# Patient Record
Sex: Male | Born: 1945 | Race: Black or African American | Hispanic: No | Marital: Married | Smoking: Never smoker
Health system: Southern US, Community
[De-identification: ages and names within clinical notes are randomized; demographics above are authoritative.]

## PROBLEM LIST (undated history)

## (undated) DIAGNOSIS — J309 Allergic rhinitis, unspecified: Secondary | ICD-10-CM

## (undated) DIAGNOSIS — I10 Essential (primary) hypertension: Secondary | ICD-10-CM

## (undated) DIAGNOSIS — J189 Pneumonia, unspecified organism: Secondary | ICD-10-CM

## (undated) DIAGNOSIS — N400 Enlarged prostate without lower urinary tract symptoms: Secondary | ICD-10-CM

## (undated) DIAGNOSIS — E785 Hyperlipidemia, unspecified: Secondary | ICD-10-CM

## (undated) HISTORY — PX: PROSTATE SURGERY: SHX751

## (undated) HISTORY — DX: Essential (primary) hypertension: I10

## (undated) HISTORY — DX: Allergic rhinitis, unspecified: J30.9

## (undated) HISTORY — DX: Hyperlipidemia, unspecified: E78.5

## (undated) HISTORY — DX: Pneumonia, unspecified organism: J18.9

---

## 2017-03-08 ENCOUNTER — Emergency Department (HOSPITAL_BASED_OUTPATIENT_CLINIC_OR_DEPARTMENT_OTHER)
Admission: EM | Admit: 2017-03-08 | Discharge: 2017-03-09 | Disposition: A | Discharge status: Home / Self Care | Payer: Medicare Other | Attending: Emergency Medicine | Admitting: Emergency Medicine

## 2017-03-08 ENCOUNTER — Encounter (HOSPITAL_BASED_OUTPATIENT_CLINIC_OR_DEPARTMENT_OTHER): Payer: Self-pay

## 2017-03-08 DIAGNOSIS — R300 Dysuria: Secondary | ICD-10-CM | POA: Diagnosis present

## 2017-03-08 DIAGNOSIS — R339 Retention of urine, unspecified: Secondary | ICD-10-CM

## 2017-03-08 DIAGNOSIS — N3001 Acute cystitis with hematuria: Secondary | ICD-10-CM | POA: Diagnosis not present

## 2017-03-08 HISTORY — DX: Benign prostatic hyperplasia without lower urinary tract symptoms: N40.0

## 2017-03-08 LAB — URINALYSIS, ROUTINE W REFLEX MICROSCOPIC
BILIRUBIN URINE: NEGATIVE
Glucose, UA: NEGATIVE mg/dL
KETONES UR: NEGATIVE mg/dL
NITRITE: POSITIVE — AB
PROTEIN: NEGATIVE mg/dL
Specific Gravity, Urine: 1.025 (ref 1.005–1.030)
pH: 6 (ref 5.0–8.0)

## 2017-03-08 LAB — URINALYSIS, MICROSCOPIC (REFLEX)

## 2017-03-08 MED ORDER — LEVOFLOXACIN 750 MG PO TABS
750.0000 mg | ORAL_TABLET | Freq: Once | ORAL | Status: AC
  Administered 2017-03-08: 750 mg via ORAL
  Filled 2017-03-08: qty 1

## 2017-03-08 MED ORDER — MORPHINE SULFATE (PF) 2 MG/ML IV SOLN
2.0000 mg | Freq: Once | INTRAVENOUS | Status: AC
  Administered 2017-03-08: 2 mg via INTRAVENOUS
  Filled 2017-03-08: qty 1

## 2017-03-08 MED ORDER — LIDOCAINE HCL 2 % EX GEL
CUTANEOUS | Status: AC
Start: 2017-03-08 — End: 2017-03-08
  Administered 2017-03-08: 20
  Filled 2017-03-08: qty 20

## 2017-03-08 NOTE — ED Triage Notes (Signed)
C/o urinary freq and dysuria x 3 weeks

## 2017-03-08 NOTE — ED Notes (Signed)
ED Provider at bedside. 

## 2017-03-08 NOTE — Discharge Instructions (Addendum)
[facility] for urology evaluation and Foley catheter placement. You will need antibiotics at discharge for your urinary tract infection.

## 2017-03-08 NOTE — ED Notes (Signed)
Patient claimed that it hurts and burns when he urinate.  His urine only comes out little and it dribbles per patient and wife.

## 2017-03-08 NOTE — ED Provider Notes (Signed)
[facility]   CSN: [id number] Arrival date & time: 03/08/17  1508     History   Chief Complaint Chief Complaint  Patient presents with  . Urinary Frequency    HPI Steven Jimenez is a 71 y.o. male.  The history is provided by the patient and the spouse. No language interpreter was used.  Dysuria   This is a new problem. The current episode started more than 1 week ago. The problem occurs every urination. The problem has been rapidly worsening. The quality of the pain is described as burning and stabbing. The pain is at a severity of 8/10. The pain is severe. There has been no fever. Associated symptoms include frequency, hesitancy and urgency. Pertinent negatives include no chills, no nausea, no vomiting, no hematuria and no flank pain. He has tried nothing for the symptoms. His past medical history is significant for urological procedure (2 months ago). His past medical history does not include recurrent UTIs.    Past Medical History:  Diagnosis Date  . Enlarged prostate     There are no active problems to display for this patient.   History reviewed. No pertinent surgical history.     Home Medications    Prior to Admission medications   Not on File    Family History No family history on file.  Social History Social History  Substance Use Topics  . Smoking status: Never Smoker  . Smokeless tobacco: Never Used  . Alcohol use Yes     Comment: occ     Allergies   Patient has no known allergies.   Review of Systems Review of Systems  Constitutional: Negative for appetite change, chills, fatigue and fever.  HENT: Negative for congestion.   Respiratory: Negative for chest tightness and shortness of breath.   Cardiovascular: Negative for chest pain, palpitations and leg swelling.  Gastrointestinal: Positive for abdominal pain. Negative for constipation, diarrhea, nausea and vomiting.  Genitourinary: Positive for decreased urine  volume, difficulty urinating, dysuria, frequency, hesitancy, penile pain, penile swelling and urgency. Negative for discharge, flank pain, hematuria and testicular pain.  Musculoskeletal: Negative for back pain and neck pain.  Skin: Negative for rash and wound.  Neurological: Negative for light-headedness and headaches.  Psychiatric/Behavioral: Negative for agitation.  All other systems reviewed and are negative.    Physical Exam Updated Vital Signs BP (!) 161/91 (BP Location: Right Arm)   Pulse 73   Temp 98.3 F (36.8 C) (Oral)   Resp 18   Ht 5\' 5"  (1.651 m)   Wt 79.4 kg (175 lb 0.7 oz)   SpO2 100%   BMI 29.13 kg/m   Physical Exam  Constitutional: He is oriented to person, place, and time. He appears well-developed and well-nourished. No distress.  HENT:  Head: Normocephalic and atraumatic.  Mouth/Throat: Oropharynx is clear and moist. No oropharyngeal exudate.  Eyes: Conjunctivae are normal.  Neck: Neck supple.  Cardiovascular: Normal rate and regular rhythm.   No murmur heard. Pulmonary/Chest: Effort normal and breath sounds normal. No respiratory distress. He has no wheezes. He exhibits no tenderness.  Abdominal: Soft. Normal appearance. There is tenderness in the suprapubic area. There is no rigidity, no rebound, no guarding and no CVA tenderness. Hernia confirmed negative in the right inguinal area and confirmed negative in the left inguinal area.    Genitourinary: Testes normal. Right testis shows no tenderness. Left testis shows no tenderness. Circumcised. No penile erythema or penile tenderness. No discharge found.  Musculoskeletal: He  exhibits no edema or tenderness.  Neurological: He is alert and oriented to person, place, and time. No cranial nerve deficit or sensory deficit. He exhibits normal muscle tone.  Skin: Skin is warm and dry. Capillary refill takes less than 2 seconds. He is not diaphoretic. No erythema. No pallor.  Psychiatric: He has a normal mood and  affect.  Nursing note and vitals reviewed.    ED Treatments / Results  Labs (all labs ordered are listed, but only abnormal results are displayed) Labs Reviewed  URINALYSIS, ROUTINE W REFLEX MICROSCOPIC - Abnormal; Notable for the following:       Result Value   APPearance CLOUDY (*)    Hgb urine dipstick LARGE (*)    Nitrite POSITIVE (*)    Leukocytes, UA LARGE (*)    All other components within normal limits  URINALYSIS, MICROSCOPIC (REFLEX) - Abnormal; Notable for the following:    Bacteria, UA MANY (*)    Squamous Epithelial / LPF 0-5 (*)    All other components within normal limits  URINE CULTURE    EKG  EKG Interpretation None       Radiology No results found.  Procedures Procedures (including critical care time)  Medications Ordered in ED Medications  lidocaine (XYLOCAINE) 2 % jelly (20 application  Given 03/08/17 2026)  levofloxacin (LEVAQUIN) tablet 750 mg (750 mg Oral Given 03/08/17 2045)  morphine 2 MG/ML injection 2 mg (2 mg Intravenous Given 03/08/17 2354)  HYDROcodone-acetaminophen (NORCO/VICODIN) 5-325 MG per tablet 1 tablet (1 tablet Oral Given 03/09/17 0102)     Initial Impression / Assessment and Plan / ED Course  I have reviewed the triage vital signs and the nursing notes.  Pertinent labs & imaging results that were available during my care of the patient were reviewed by me and considered in my medical decision making (see chart for details).     Steven Jimenez is a 71 y.o. male with a past medical history significant for BPH with recent urological procedure 2 months ago who presents with abdominal pain, dysuria, and difficulty with urination. He says that for the last 3 weeks, he has had gradually worsening dysuria. He says that he is having a more difficult time urinating culminating in having only dribbling urination today. He says that he is having lower abdominal cramping and pain that is moderate to severe. He denies recent traumatic injuries. He  denies any significant back pain or back tenderness. He denies fevers, chills, nausea, vomiting, constipation, or diarrhea. He reports that he is from [place] and this is where he had his urology procedure.  On exam, patient has no CVA tenderness. Mild lower abdominal tenderness in the suprapubic area. GU exam revealed uncircumcised penis with no rashes or wounds present. No hernias present on exam. Patient was observed trying to urinate and had distention of the inferior penis as the urethra appeared to have urine flow however approximately 1 cm from the end of the penis at the meatus, patient had no urination. Suspect a very distal urethral problem.  Patient and wife report that patient had a Foley catheter during his procedure that was removed without difficulty.  Patient had urinalysis on small amount of urine he was able to reduce showing evidence of UTI. Patient given antibiotics. Due to concern for urethral abnormality or problem, urology was called. Urology recommended Foley catheter placement attempt. Initially, a 28[address] [place] coud catheter was attempted without success. Subsequently urology was called and they recommended a small normal Foley. This  was also attempted without success.  Due to failure 2 with the smallest Foley catheter present at this facility, decision made to transfer patient to [facility] where he can be evaluated by urology. Patient received a dose of antibiotics prior to transfer.  Patient will be transferred for further management. Anticipate Foley placement by urology and discharged with antibiotics. Patient was transferred in stable condition for further management.   Final Clinical Impressions(s) / ED Diagnoses   Final diagnoses:  Acute cystitis with hematuria  Urinary retention     Clinical Impression: 1. Acute cystitis with hematuria   2. Urinary retention     Disposition: Transfer to [facility] by POV for eval by urology    Tegeler, Canary Brim,  MD 03/09/17 [phone]

## 2017-03-08 NOTE — ED Notes (Signed)
Urologist at bedside.

## 2017-03-08 NOTE — ED Notes (Signed)
Attempted to insert foley catheter using sterile technique per protocol, Windell Mouldinguth RN assisting, unable to insert 116fr coude catheter, obstruction noted at tip of penis. Catheter inserts less than 2 cm, attempted various angles to get catheter to pass unsuccessful. Pt noted with clear urine passing with attempts at inserting catheter. Dr. Julieanne Mansonegler alerted to bedside, multiple attempts are unsuccessful in passing catheter beyond tip of penis. Pt tolerated well, denies any pain, states "I really feel better now, because the urine came out." pt assisted out of bed, full linen change and bed cleaning, gown changed.

## 2017-03-09 DIAGNOSIS — N3001 Acute cystitis with hematuria: Secondary | ICD-10-CM | POA: Diagnosis not present

## 2017-03-09 MED ORDER — HYDROCODONE-ACETAMINOPHEN 5-325 MG PO TABS
1.0000 | ORAL_TABLET | Freq: Once | ORAL | Status: AC
  Administered 2017-03-09: 1 via ORAL
  Filled 2017-03-09: qty 1

## 2017-03-09 MED ORDER — SULFAMETHOXAZOLE-TRIMETHOPRIM 800-160 MG PO TABS: 1.0000 | ORAL_TABLET | Freq: Two times a day (BID) | ORAL | 0 refills | Status: AC

## 2017-03-09 MED ORDER — HYDROCODONE-ACETAMINOPHEN 5-325 MG PO TABS: 1.0000 | ORAL_TABLET | Freq: Four times a day (QID) | ORAL | 0 refills | Status: DC | PRN

## 2017-03-09 NOTE — ED Provider Notes (Signed)
[facility]-EMERGENCY DEPT Provider Note   CSN: [id number] Arrival date & time: 03/08/17  1508     History   Chief Complaint Chief Complaint  Patient presents with  . Urinary Frequency    HPI Candice Lunney is a 71 y.o. male.  HPI  Patient presents for evaluation of worsening urinary retention over past 24 hrs.  No fever/vomiting.  He reports only passing small amt of urine.  He has low abd pain No back pain No other complaints He has h/o "prostate surgery" back in [place]   Past Medical History:  Diagnosis Date  . Enlarged prostate     There are no active problems to display for this patient.   History reviewed. No pertinent surgical history.     Home Medications    Prior to Admission medications   Medication Sig Start Date End Date Taking? Authorizing Provider  phenazopyridine (PYRIDIUM) 97 MG tablet Take 97 mg by mouth daily.   Yes [provider]  HYDROcodone-acetaminophen (NORCO/VICODIN) 5-325 MG tablet Take 1 tablet by mouth every 6 (six) hours as needed for severe pain. 03/09/17   Zadie Rhine, MD  sulfamethoxazole-trimethoprim (BACTRIM DS,SEPTRA DS) 800-160 MG tablet Take 1 tablet by mouth 2 (two) times daily. 03/09/17 03/16/17  Zadie Rhine, MD    Family History No family history on file.  Social History Social History  Substance Use Topics  . Smoking status: Never Smoker  . Smokeless tobacco: Never Used  . Alcohol use Yes     Comment: occ     Allergies   Patient has no known allergies.   Review of Systems Review of Systems  Constitutional: Negative for fever.  Gastrointestinal: Negative for vomiting.  Genitourinary: Positive for difficulty urinating.  All other systems reviewed and are negative.    Physical Exam Updated Vital Signs BP (!) 174/99 (BP Location: Left Arm)   Pulse 76   Temp (!) 97.5 F (36.4 C) (Oral)   Resp 16   Ht 1.651 m (5\' 5" )   Wt 79.4 kg (175 lb 0.7 oz)   SpO2 97%   BMI 29.13 kg/m   Physical  Exam CONSTITUTIONAL: Well developed/well nourished HEAD: Normocephalic/atraumatic EYES: EOMI ENMT: Mucous membranes moist NECK: supple no meningeal signs SPINE/BACK:entire spine nontender CV: S1/S2 noted, no murmurs/rubs/gallops noted LUNGS: Lungs are clear to auscultation bilaterally, no apparent distress ABDOMEN: soft  GU:no cva tenderness, mild suprapubic tenderness. Testicles descended bilaterally, no tenderness, no erythema noted, wife present at patient request NEURO: Pt is awake/alert/appropriate, moves all extremitiesx4.  No facial droop.   EXTREMITIES:   full ROM SKIN: warm, color normal PSYCH: no abnormalities of mood noted, alert and oriented to situation   ED Treatments / Results  Labs (all labs ordered are listed, but only abnormal results are displayed) Labs Reviewed  URINALYSIS, ROUTINE W REFLEX MICROSCOPIC - Abnormal; Notable for the following:       Result Value   APPearance CLOUDY (*)    Hgb urine dipstick LARGE (*)    Nitrite POSITIVE (*)    Leukocytes, UA LARGE (*)    All other components within normal limits  URINALYSIS, MICROSCOPIC (REFLEX) - Abnormal; Notable for the following:    Bacteria, UA MANY (*)    Squamous Epithelial / LPF 0-5 (*)    All other components within normal limits  URINE CULTURE    EKG  EKG Interpretation None       Radiology No results found.  Procedures Procedures (including critical care time)  Medications Ordered in  ED Medications  HYDROcodone-acetaminophen (NORCO/VICODIN) 5-325 MG per tablet 1 tablet (not administered)  lidocaine (XYLOCAINE) 2 % jelly (20 application  Given 03/08/17 2026)  levofloxacin (LEVAQUIN) tablet 750 mg (750 mg Oral Given 03/08/17 2045)  morphine 2 MG/ML injection 2 mg (2 mg Intravenous Given 03/08/17 2354)     Initial Impression / Assessment and Plan / ED Course  I have reviewed the triage vital signs and the nursing notes.  Pertinent labs  results that were available during my care of the  patient were reviewed by me and considered in my medical decision making (see chart for details).     Pt sent for evaluation for foley placement [facility] U[facility] dr winter consulted who placed foley Pt tolerated well Will place leg bag Will start bactrim at urology request Narcotic database reviewed and considered in decision making   Final Clinical Impressions(s) / ED Diagnoses   Final diagnoses:  Acute cystitis with hematuria  Urinary retention    New Prescriptions New Prescriptions   HYDROCODONE-ACETAMINOPHEN (NORCO/VICODIN) 5-325 MG TABLET    Take 1 tablet by mouth every 6 (six) hours as needed for severe pain.   SULFAMETHOXAZOLE-TRIMETHOPRIM (BACTRIM DS,SEPTRA DS) 800-160 MG TABLET    Take 1 tablet by mouth 2 (two) times daily.     Zadie RhineWickline, Oryn Casanova, MD 03/09/17 [phone]

## 2017-03-09 NOTE — Consult Note (Signed)
Urology Preoperative H&P   Chief Complain: Pain with urination  History of Present Illness: Steven Jimenez is a 71 y.o. male who initially presented to the [place] department with difficulty and pain with urination. He underwent a TURP on 12/08/2016 in [place]. Postoperatively, the patient reports a progressively weakening force of stream along with urgency/frequency, hesitancy and dysuria. He denies interval urinary tract infections or gross hematuria. Multiple attempts at the [facility] were made to place a Foley catheter, but were unsuccessful. He was then transferred to [facility] emergency room for urologic evaluation. Past Medical History:  Diagnosis Date  . Enlarged prostate     History reviewed. No pertinent surgical history.  Allergies: No Known Allergies  No family history on file.  Social History:  reports that he has never smoked. He has never used smokeless tobacco. He reports that he drinks alcohol. He reports that he does not use drugs.  ROS: A complete review of systems was performed.  All systems are negative except for pertinent findings as noted.  Physical Exam:  Vital signs in last 24 hours: Temp:  [97.5 F (36.4 C)-98.3 F (36.8 C)] 97.5 F (36.4 C) (09/04 2111) Pulse Rate:  [73-82] 76 (09/04 2258) Resp:  [16-18] 16 (09/04 2258) BP: (151-174)/(86-99) 174/99 (09/04 2258) SpO2:  [96 %-100 %] 97 % (09/04 2258) Weight:  [79.4 kg (175 lb 0.7 oz)] 79.4 kg (175 lb 0.7 oz) (09/04 1527) Constitutional:  Alert and oriented, No acute distress Cardiovascular: Regular rate and rhythm, No JVD Respiratory: Normal respiratory effort, Lungs clear bilaterally GI: Abdomen is soft, nontender, nondistended, no abdominal masses GU: No CVA tenderness. The penis is circumcised and there is no blood at the urethral meatus. Both testicles are descended and demonstrated no masses or nodules, bilaterally. An attempt was made to pass a [address] [place] [employer]  tip catheter, but resistance was met in the fossa navicularis. Lymphatic: No lymphadenopathy Neurologic: Grossly intact, no focal deficits Psychiatric: Normal mood and affect  Laboratory Data:  No results for input(s): WBC, HGB, HCT, PLT in the last 72 hours.  No results for input(s): NA, K, CL, GLUCOSE, BUN, CALCIUM, CREATININE in the last 72 hours.  Invalid input(s): CO3   Results for orders placed or performed during the hospital encounter of 03/08/17 (from the past 24 hour(s))  Urinalysis, Routine w reflex microscopic- may I&O cath if menses     Status: Abnormal   Collection Time: 03/08/17  3:45 PM  Result Value Ref Range   Color, Urine YELLOW YELLOW   APPearance CLOUDY (A) CLEAR   Specific Gravity, Urine 1.025 1.005 - 1.030   pH 6.0 5.0 - 8.0   Glucose, UA NEGATIVE NEGATIVE mg/dL   Hgb urine dipstick LARGE (A) NEGATIVE   Bilirubin Urine NEGATIVE NEGATIVE   Ketones, ur NEGATIVE NEGATIVE mg/dL   Protein, ur NEGATIVE NEGATIVE mg/dL   Nitrite POSITIVE (A) NEGATIVE   Leukocytes, UA LARGE (A) NEGATIVE  Urinalysis, Microscopic (reflex)     Status: Abnormal   Collection Time: 03/08/17  3:45 PM  Result Value Ref Range   RBC / HPF 6-30 0 - 5 RBC/hpf   WBC, UA TOO NUMEROUS TO COUNT 0 - 5 WBC/hpf   Bacteria, UA MANY (A) NONE SEEN   Squamous Epithelial / LPF 0-5 (A) NONE SEEN   No results found for this or any previous visit (from the past 240 hour(s)).  Renal Function: No results for input(s): CREATININE in the last 168 hours. CrCl cannot  be calculated (No order found.).  Radiologic Imaging: No results found.  I independently reviewed the above imaging studies.  Assessment and Plan Becket Wecker is a 71 y.o. 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 A 12 [place] council tip catheter was advanced over the wire and into the patient's bladder, with return of  clear urine. 10 mL of sterile water was then used to inflate the Foley balloon and the catheter was placed to gravity drainage. A urine specimen has already been sent for a urine culture. Will start empiric Bactrim DS for 7 days given his findings on urinalysis. The patient can follow-up with a [facility] as needed in 2-3 weeks. He has scheduled follow-up with his urologist in [place].     Conception Oms [place] 03/09/2017, 12:04 AM

## 2017-03-10 LAB — URINE CULTURE: Culture: NO GROWTH

## 2018-09-25 ENCOUNTER — Inpatient Hospital Stay (HOSPITAL_COMMUNITY)
Admission: EM | Admit: 2018-09-25 | Discharge: 2018-10-03 | DRG: 193 | Disposition: A | Discharge status: Home Health | Payer: Medicare Other | Attending: Family Medicine | Admitting: Family Medicine

## 2018-09-25 ENCOUNTER — Encounter (HOSPITAL_COMMUNITY): Payer: Self-pay

## 2018-09-25 ENCOUNTER — Emergency Department (HOSPITAL_COMMUNITY)
Admission: EM | Admit: 2018-09-25 | Discharge: 2018-09-25 | Discharge status: Against Medical Advice | Payer: Medicare Other

## 2018-09-25 ENCOUNTER — Other Ambulatory Visit: Payer: Self-pay

## 2018-09-25 ENCOUNTER — Emergency Department (HOSPITAL_COMMUNITY): Payer: Medicare Other

## 2018-09-25 DIAGNOSIS — B9729 Other coronavirus as the cause of diseases classified elsewhere: Secondary | ICD-10-CM | POA: Diagnosis present

## 2018-09-25 DIAGNOSIS — E119 Type 2 diabetes mellitus without complications: Secondary | ICD-10-CM | POA: Diagnosis present

## 2018-09-25 DIAGNOSIS — R059 Cough, unspecified: Secondary | ICD-10-CM

## 2018-09-25 DIAGNOSIS — R29703 NIHSS score 3: Secondary | ICD-10-CM | POA: Diagnosis present

## 2018-09-25 DIAGNOSIS — J189 Pneumonia, unspecified organism: Secondary | ICD-10-CM

## 2018-09-25 DIAGNOSIS — R4701 Aphasia: Secondary | ICD-10-CM

## 2018-09-25 DIAGNOSIS — I472 Ventricular tachycardia, unspecified: Secondary | ICD-10-CM

## 2018-09-25 DIAGNOSIS — J1289 Other viral pneumonia: Principal | ICD-10-CM | POA: Diagnosis present

## 2018-09-25 DIAGNOSIS — J9601 Acute respiratory failure with hypoxia: Secondary | ICD-10-CM

## 2018-09-25 DIAGNOSIS — R05 Cough: Secondary | ICD-10-CM | POA: Diagnosis not present

## 2018-09-25 DIAGNOSIS — Z79899 Other long term (current) drug therapy: Secondary | ICD-10-CM | POA: Diagnosis not present

## 2018-09-25 DIAGNOSIS — N400 Enlarged prostate without lower urinary tract symptoms: Secondary | ICD-10-CM | POA: Diagnosis present

## 2018-09-25 DIAGNOSIS — R0902 Hypoxemia: Secondary | ICD-10-CM

## 2018-09-25 DIAGNOSIS — R269 Unspecified abnormalities of gait and mobility: Secondary | ICD-10-CM | POA: Diagnosis present

## 2018-09-25 DIAGNOSIS — Z79891 Long term (current) use of opiate analgesic: Secondary | ICD-10-CM | POA: Diagnosis not present

## 2018-09-25 DIAGNOSIS — R509 Fever, unspecified: Secondary | ICD-10-CM

## 2018-09-25 DIAGNOSIS — E8770 Fluid overload, unspecified: Secondary | ICD-10-CM | POA: Diagnosis not present

## 2018-09-25 DIAGNOSIS — G9341 Metabolic encephalopathy: Secondary | ICD-10-CM | POA: Diagnosis present

## 2018-09-25 DIAGNOSIS — J1281 Pneumonia due to SARS-associated coronavirus: Secondary | ICD-10-CM | POA: Diagnosis present

## 2018-09-25 DIAGNOSIS — F0390 Unspecified dementia without behavioral disturbance: Secondary | ICD-10-CM | POA: Diagnosis present

## 2018-09-25 LAB — DIFFERENTIAL
Abs Immature Granulocytes: 0.03 10*3/uL (ref 0.00–0.07)
Basophils Absolute: 0 10*3/uL (ref 0.0–0.1)
Basophils Relative: 0 %
EOS ABS: 0 10*3/uL (ref 0.0–0.5)
EOS PCT: 0 %
Immature Granulocytes: 0 %
Lymphocytes Relative: 15 %
Lymphs Abs: 1.3 10*3/uL (ref 0.7–4.0)
Monocytes Absolute: 0.5 10*3/uL (ref 0.1–1.0)
Monocytes Relative: 5 %
Neutro Abs: 7.4 10*3/uL (ref 1.7–7.7)
Neutrophils Relative %: 80 %

## 2018-09-25 LAB — COMPREHENSIVE METABOLIC PANEL
ALT: 139 U/L — ABNORMAL HIGH (ref 0–44)
AST: 104 U/L — ABNORMAL HIGH (ref 15–41)
Albumin: 3.3 g/dL — ABNORMAL LOW (ref 3.5–5.0)
Alkaline Phosphatase: 60 U/L (ref 38–126)
Anion gap: 9 (ref 5–15)
BUN: 19 mg/dL (ref 8–23)
CO2: 26 mmol/L (ref 22–32)
Calcium: 8.7 mg/dL — ABNORMAL LOW (ref 8.9–10.3)
Chloride: 105 mmol/L (ref 98–111)
Creatinine, Ser: 1.06 mg/dL (ref 0.61–1.24)
GFR calc Af Amer: >60 mL/min (ref >60)
GFR calc non Af Amer: >60 mL/min (ref >60)
Glucose, Bld: 146 mg/dL — ABNORMAL HIGH (ref 70–99)
POTASSIUM: 4.2 mmol/L (ref 3.5–5.1)
Sodium: 140 mmol/L (ref 135–145)
Total Bilirubin: 0.6 mg/dL (ref 0.3–1.2)
Total Protein: 8.2 g/dL — ABNORMAL HIGH (ref 6.5–8.1)

## 2018-09-25 LAB — CBC
HCT: 47.9 % (ref 39.0–52.0)
Hemoglobin: 15.5 g/dL (ref 13.0–17.0)
MCH: 25.2 pg — ABNORMAL LOW (ref 26.0–34.0)
MCHC: 32.4 g/dL (ref 30.0–36.0)
MCV: 78 fL — AB (ref 80.0–100.0)
PLATELETS: 215 10*3/uL (ref 150–400)
RBC: 6.14 MIL/uL — ABNORMAL HIGH (ref 4.22–5.81)
RDW: 14.1 % (ref 11.5–15.5)
WBC: 9.2 10*3/uL (ref 4.0–10.5)
nRBC: 0 % (ref 0.0–0.2)

## 2018-09-25 LAB — HEMOGLOBIN A1C
Hgb A1c MFr Bld: 7.3 % — ABNORMAL HIGH (ref 4.8–5.6)
Mean Plasma Glucose: 162.81 mg/dL

## 2018-09-25 LAB — PROTIME-INR
INR: 1 (ref 0.8–1.2)
Prothrombin Time: 13.1 seconds (ref 11.4–15.2)

## 2018-09-25 LAB — LACTATE DEHYDROGENASE: LDH: 394 U/L — ABNORMAL HIGH (ref 98–192)

## 2018-09-25 LAB — APTT: APTT: 27 s (ref 24–36)

## 2018-09-25 LAB — SEDIMENTATION RATE: Sed Rate: 26 mm/hr — ABNORMAL HIGH (ref 0–16)

## 2018-09-25 LAB — I-STAT TROPONIN, ED: Troponin i, poc: 0.01 ng/mL (ref 0.00–0.08)

## 2018-09-25 LAB — PROCALCITONIN: Procalcitonin: 0.22 ng/mL

## 2018-09-25 LAB — LACTIC ACID, PLASMA: Lactic Acid, Venous: 1.7 mmol/L (ref 0.5–1.9)

## 2018-09-25 MED ORDER — SODIUM CHLORIDE 0.9 % IV SOLN
500.0000 mg | INTRAVENOUS | Status: DC
  Administered 2018-09-26 – 2018-09-28 (×3): 500 mg via INTRAVENOUS
  Filled 2018-09-25 (×4): qty 500

## 2018-09-25 MED ORDER — ONDANSETRON HCL 4 MG/2ML IJ SOLN: 4.0000 mg | Freq: Four times a day (QID) | INTRAMUSCULAR | Status: DC | PRN

## 2018-09-25 MED ORDER — SODIUM CHLORIDE 0.9 % IV SOLN
1.0000 g | INTRAVENOUS | Status: AC
  Administered 2018-09-26 – 2018-10-01 (×6): 1 g via INTRAVENOUS
  Filled 2018-09-25 (×6): qty 10

## 2018-09-25 MED ORDER — ASPIRIN EC 81 MG PO TBEC
81.0000 mg | DELAYED_RELEASE_TABLET | Freq: Every day | ORAL | Status: DC
  Administered 2018-09-26 – 2018-10-03 (×9): 81 mg via ORAL
  Filled 2018-09-25 (×9): qty 1

## 2018-09-25 MED ORDER — SODIUM CHLORIDE 0.9 % IV SOLN
1.0000 g | Freq: Once | INTRAVENOUS | Status: AC
  Administered 2018-09-25: 1 g via INTRAVENOUS
  Filled 2018-09-25: qty 10

## 2018-09-25 MED ORDER — SODIUM CHLORIDE 0.9% FLUSH
3.0000 mL | Freq: Two times a day (BID) | INTRAVENOUS | Status: DC
  Administered 2018-09-26 – 2018-10-01 (×11): 3 mL via INTRAVENOUS

## 2018-09-25 MED ORDER — SODIUM CHLORIDE 0.9 % IV SOLN
500.0000 mg | Freq: Once | INTRAVENOUS | Status: AC
  Administered 2018-09-25: 500 mg via INTRAVENOUS
  Filled 2018-09-25: qty 500

## 2018-09-25 MED ORDER — HEPARIN SODIUM (PORCINE) 5000 UNIT/ML IJ SOLN
5000.0000 [IU] | Freq: Three times a day (TID) | INTRAMUSCULAR | Status: DC
  Administered 2018-09-26 – 2018-09-27 (×5): 5000 [IU] via SUBCUTANEOUS
  Filled 2018-09-25 (×5): qty 1

## 2018-09-25 MED ORDER — SODIUM CHLORIDE 0.9 % IV SOLN
INTRAVENOUS | Status: AC
  Administered 2018-09-26: 01:00:00 via INTRAVENOUS

## 2018-09-25 MED ORDER — SODIUM CHLORIDE 0.9% FLUSH: 3.0000 mL | Freq: Once | INTRAVENOUS | Status: DC

## 2018-09-25 MED ORDER — ACETAMINOPHEN 325 MG PO TABS: 650.0000 mg | ORAL_TABLET | Freq: Four times a day (QID) | ORAL | Status: DC | PRN

## 2018-09-25 MED ORDER — ONDANSETRON HCL 4 MG PO TABS: 4.0000 mg | ORAL_TABLET | Freq: Four times a day (QID) | ORAL | Status: DC | PRN

## 2018-09-25 MED ORDER — IBUPROFEN 400 MG PO TABS
400.0000 mg | ORAL_TABLET | Freq: Once | ORAL | Status: AC
  Administered 2018-09-25: 400 mg via ORAL
  Filled 2018-09-25: qty 1

## 2018-09-25 NOTE — H&P (Signed)
History and Physical    Steven Jimenez JQZ:[id number] DOB: [birth year] DOA: 09/25/2018  PCP: Patient, No Pcp Per  Patient coming from: PCP office  I have personally briefly reviewed patient's old medical records in [place]  Chief Complaint: Speech difficulty, flulike illness   HPI: Steven Jimenez is a 73 y.o. male with medical history significant for hyperlipidemia and enlarged prostate who presents to ED with fevers, chills, cough productive of white sputum, fatigue, body aches, and shortness of breath.  At the time of my evaluation, patient is able to provide partial history; remainder of history is obtained from EDP and chart review.  In addition to the above symptoms, he reportedly developed aphasia and gait abnormality 2 days ago.  He reports having an episode of clear emesis with occasional difficulty swallowing food.  He denies any diarrhea.  He saw his PCP today at [facility] obtain a chest x-ray which reportedly showed a right-sided pneumonia.  He reportedly had a negative flu test and a coronavirus test which was obtained today and pending.  Patient himself reports a sick contact in his wife who is reportedly on antibiotics for treatment of pneumonia.  He also reports travel back and forth to [place] with last trip about 3 weeks ago.  ED Course:  Initial vitals in the ED showed BP 132/78, pulse 90, RR 18, temp 99.1 Fahrenheit, SPO2 90% on room air.  Repeat vitals in the ED were notable for temp 101.4 Fahrenheit, increased respiratory rate to 28-29, and decreased oxygen saturation to 87% on room air which improved with placement of 3 L supplemental O2 via [place].  Labs are notable for WBC 9.2, hemoglobin 15.5, platelets 215, sodium 140, potassium 4.2, AST 104, ALT 139, alk phos 60, total bilirubin 0.6, BUN 19, creatinine 1.06, lactic acid 1.7, i-STAT troponin 0.01.  Neurology were consulted.  CT head without contrast showed mild age-related volume loss with slight  periventricular small vessel disease, no acute infarct, mass, or hemorrhage.  MRI brain without contrast was ordered and pending.  Portable chest x-ray showed multifocal pneumonia patchy airspace opacities in both lung bases and right midlung suspicious for atypical/viral pneumonia.  Blood cultures were drawn and patient was started on IV ceftriaxone and azithromycin.  He was given oral ibuprofen once.  The hospitalist service was consulted to admit for further evaluation and management.  Review of Systems: As per HPI otherwise 10 point review of systems negative.    Past Medical History:  Diagnosis Date  . Enlarged prostate     History reviewed. No pertinent surgical history.   reports that he has never smoked. He has never used smokeless tobacco. He reports current alcohol use. He reports that he does not use drugs.  No Known Allergies  Family History  Family history unknown: Yes     Prior to Admission medications   Medication Sig Start Date End Date Taking? Authorizing Provider  HYDROcodone-acetaminophen (NORCO/VICODIN) 5-325 MG tablet Take 1 tablet by mouth every 6 (six) hours as needed for severe pain. 03/09/17   Ripley Fraise, MD  phenazopyridine (PYRIDIUM) 97 MG tablet Take 97 mg by mouth daily.    [provider]    Physical Exam: Vitals:   09/25/18 2027 09/25/18 2042 09/25/18 2100 09/25/18 2130  BP:   (!) 143/77 123/80  Pulse: 87 87 87 86  Resp: (!) 24 (!) 25 (!) 28 (!) 29  Temp:      TempSrc:      SpO2: (!) 89%  96% 96% 95%    Constitutional: Elderly man resting supine in bed, NAD, calm, comfortable Eyes: PERRL, EOMI, lids and conjunctivae normal ENMT: Mucous membranes are moist. Posterior pharynx clear of any exudate or lesions. Normal dentition.  Neck: normal, supple, no masses. Respiratory: Distant breath sounds, limited exam using disposable stethoscope.  Normal respiratory effort. No accessory muscle use.  Cardiovascular: Regular rate and rhythm,  no murmurs / rubs / gallops. No extremity edema.  Abdomen: no tenderness, no masses palpated. No hepatosplenomegaly. Bowel sounds positive.  Musculoskeletal: no clubbing / cyanosis. No joint deformity upper and lower extremities. Good ROM, no contractures. Normal muscle tone.  Skin: no rashes, lesions, ulcers. No induration Neurologic: Slow speech, intermittent episodes of questionable aphasia, CN 2-12 otherwise grossly intact. Sensation intact, DTR normal. Strength 5/5 in all 4.  Minimal dysmetria and slow movement with finger-to-nose testing. Psychiatric:  Alert and oriented to person, place, and year. Normal mood.     Labs on Admission: I have personally reviewed following labs and imaging studies  CBC: Recent Labs  Lab 09/25/18 1804  WBC 9.2  NEUTROABS 7.4  HGB 15.5  HCT 47.9  MCV 78.0*  PLT 488   Basic Metabolic Panel: Recent Labs  Lab 09/25/18 1804  NA 140  K 4.2  CL 105  CO2 26  GLUCOSE 146*  BUN 19  CREATININE 1.06  CALCIUM 8.7*   GFR: CrCl cannot be calculated (Unknown ideal weight.). Liver Function Tests: Recent Labs  Lab 09/25/18 1804  AST 104*  ALT 139*  ALKPHOS 60  BILITOT 0.6  PROT 8.2*  ALBUMIN 3.3*   No results for input(s): LIPASE, AMYLASE in the last 168 hours. No results for input(s): AMMONIA in the last 168 hours. Coagulation Profile: Recent Labs  Lab 09/25/18 1804  INR 1.0   Cardiac Enzymes: No results for input(s): CKTOTAL, CKMB, CKMBINDEX, TROPONINI in the last 168 hours. BNP (last 3 results) No results for input(s): PROBNP in the last 8760 hours. HbA1C: Recent Labs    09/25/18 2136  HGBA1C 7.3*   CBG: No results for input(s): GLUCAP in the last 168 hours. Lipid Profile: No results for input(s): CHOL, HDL, LDLCALC, TRIG, CHOLHDL, LDLDIRECT in the last 72 hours. Thyroid Function Tests: No results for input(s): TSH, T4TOTAL, FREET4, T3FREE, THYROIDAB in the last 72 hours. Anemia Panel: No results for input(s): VITAMINB12,  FOLATE, FERRITIN, TIBC, IRON, RETICCTPCT in the last 72 hours. Urine analysis:    Component Value Date/Time   COLORURINE YELLOW 03/08/2017 1545   APPEARANCEUR CLOUDY (A) 03/08/2017 1545   LABSPEC 1.025 03/08/2017 1545   PHURINE 6.0 03/08/2017 1545   GLUCOSEU NEGATIVE 03/08/2017 1545   HGBUR LARGE (A) 03/08/2017 1545   BILIRUBINUR NEGATIVE 03/08/2017 1545   KETONESUR NEGATIVE 03/08/2017 1545   PROTEINUR NEGATIVE 03/08/2017 1545   NITRITE POSITIVE (A) 03/08/2017 1545   LEUKOCYTESUR LARGE (A) 03/08/2017 1545    Radiological Exams on Admission: Ct Head Wo Contrast  Result Date: 09/25/2018 CLINICAL DATA:  Slurred speech and altered mental status EXAM: CT HEAD WITHOUT CONTRAST TECHNIQUE: Contiguous axial images were obtained from the base of the skull through the vertex without intravenous contrast. COMPARISON:  None. FINDINGS: Brain: There is mild age related volume loss. There is no intracranial mass, hemorrhage, extra-axial fluid collection, or midline shift. There is slight small vessel disease in the centra semiovale bilaterally. Brain parenchyma otherwise appears unremarkable. No acute infarct evident. Vascular: There is no hyperdense vessel. There is calcification in each carotid siphon region. Skull: The  bony calvarium appears intact. Sinuses/Orbits: Paranasal sinuses are clear. Orbits appear symmetric bilaterally. Other: Mastoid air cells are clear. IMPRESSION: Mild age related volume loss with slight periventricular small vessel disease. No acute infarct. No mass or hemorrhage. There are foci of arterial vascular calcification. Electronically Signed   By: Lowella Grip III M.D.   On: 09/25/2018 18:53   Dg Chest Portable 1 View  Result Date: 09/25/2018 CLINICAL DATA:  Shortness of breath with cough and fever EXAM: PORTABLE CHEST 1 VIEW COMPARISON:  None. FINDINGS: There is patchy airspace opacity in the right mid lung and both base regions. Heart is upper normal in size with pulmonary  vascularity normal. No adenopathy. No bone lesions. IMPRESSION: Multifocal pneumonia with patchy airspace opacity in both lung bases and right mid lung regions. Atypical pneumonia including viral pneumonia can present in this manner. No adenopathy evident. Electronically Signed   By: Lowella Grip III M.D.   On: 09/25/2018 20:01    EKG: Independently reviewed. Normal sinus rhythm, RBBB, LAFB, no prior for comparison.  Assessment/Plan Principal Problem:   Acute respiratory failure with hypoxia (HCC) Active Problems:   Multifocal pneumonia   Aphasia  Steven Jimenez is a 73 y.o. male with medical history significant for hyperlipidemia and enlarged prostate who is admitted with acute respiratory failure with hypoxia and multifocal pneumonia seen on chest x-ray as well as 2 days of aphasia and gait abnormality concerning for stroke.  Acute respiratory failure with hypoxia Multifocal pneumonia: Patient with fever, cough, shortness of breath and multifocal pneumonia seen on chest x-ray concerning for atypical/viral pneumonia including Covid-19 infection.  Other possibility would be significant aspiration pneumonia given report of occasional dysphagia and presenting aphasia. -Admit to 2W for Covid-19 rule out -Continue contact and droplet precautions with hypertension -Check respiratory viral panel and novel coronavirus -Monitor CBC with differential and CMP -Check ESR, CRP, DH, procalcitonin -Continue IV ceftriaxone and azithromycin for now -Continue IV fluids overnight -Supplemental oxygen as needed, wean off as able  Aphasia/gait abnormality: Initially completely aphasic on arrival in the ED, now improving and answering questions with slurred speech and intermittent aphasia.  Neurology were consulted and recommended MRI to evaluate for stroke. -Discussed with neurology, MRI brain and MRA Head w/o contrast ordered; further recommendations pending imaging results -Continue aspirin 81 mg  daily -Hemoglobin A1c 7.3%, lipid panel ordered and pending -Allow for permissive hypertension -PT/OT/SLP eval -Holding off on echocardiogram for now   DVT prophylaxis: subq heparin Code Status: Full code, confirmed with patient Family Communication: None present at bedside on admission, attempted to call daughter without answer Disposition Plan: Pending further management and improvement in acute respiratory failure with hypoxia, stroke work-up Consults called: Neurology Admission status: Inpatient, patient likely requires greater than 2 midnights inpatient stay for management of acute respiratory failure with hypoxia and stroke work-up as he is high risk for decompensation.   Zada Finders MD [facility] Pager [phone]  If 7PM-7AM, please contact night-coverage [email or web address]  09/25/2018, 10:25 PM

## 2018-09-25 NOTE — ED Notes (Signed)
ED Provider at bedside. 

## 2018-09-25 NOTE — ED Notes (Addendum)
Pts daughter upset that she was restricted from going back with the patient. She said " he is going in and out of consciousness and cant speak for himself"  Pt was interactive with staff , smiling, waving.  Director Misty Stanley Toben tried numerous times to speak to daughter, daughter did not allow her to talk. Daughter had told Nurse 1st pt had a corona test today, and that she also was tested and should be in quarantine.   Director tried to ask for a phone number and explain that once the pt was evaluated a provider would determine if a family member would be needed at the bedside.  Daughter replied " I see what is happening here, I am being discriminated against because he had a corona test, you will hear from my attorney" Daughter pushed pts wheelchair out of the ED still arguing that she was calling her attorney.  Daughter refused to allow pt. To be evaluated or determine if he needed a family member at bedside. Daughter was asked to leave her phone number and sit in the car while he was evaluated and staff would call her as soon as he was evaluated by a provider.

## 2018-09-25 NOTE — ED Notes (Signed)
This Clinical research associate spoke with family member PTA. Family member requesting pt have a "CT scan for a stroke." Per family member pt leaving primary care at present en route to the emergency department after a workup for "pnuemonia and stroke and had a coronavirus test;" per family member pt has a CT scan scheduled for tomorrow but is requesting a CT scan now because the pt can barely stand and has been weak for two days now. This Clinical research associate informed family member that pt would need to ask for a face mask and check in with registration on arrival.   When the pt arrived to the emergency department, family member upset related to being told could not come back with the pt to a room due to current visitation restrictions. RN 1st notified this Clinical research associate. This Clinical research associate discussed with the provider and director about family member being upset not coming back with the pt. It was discussed that the family member could leave a phone number so the provider and care staff could call her if after evaluation the provider had questions or felt that a family member would be needed a the bedside. Director, Chiropodist, and myself went to speak with upset family member. Family member informed of no visitor due to current visitor restrictions as a result of COVID. Family member verbalizes "he cannot communicate for himself." Pt alert and waved to Catering manager. Director attempted to communicate with the family member by stating that we will take the pt back to evaluate him, and if he is not able to communicate for himself then we can call you. The family member does not allow the director to finish communication and walks out with pt verbalizing we will hear from her attorney because she feels like he is being discriminated against for having a COVID test.

## 2018-09-25 NOTE — ED Notes (Signed)
ED TO INPATIENT HANDOFF REPORT  ED Nurse Name and Phone #: Ladona Ridgelaylor [phone]  S Name/Age/Gender Steven Jimenez 73 y.o. male Room/Bed: 043C/043C  Code Status   Code Status: Not on file  Home/SNF/Other Home Patient oriented to: self, place and time Is this baseline? No   Triage Complete: Triage complete  Chief Complaint Slurred Speech; Chest Pain; Loss of Balance  Triage Note Pt here for slurred speech and generalized weakness for >24 hrs.  Pt reports yesterday afternoon before dinner was the last he felt normal. Weakness is generalized no specific to a limb or a side.  A&Ox4. VAN neg   Allergies No Known Allergies  Level of Care/Admitting Diagnosis ED Disposition    ED Disposition Condition Comment   Admit  [facility] [[id number]]  Level of Care: Telemetry Medical [104]  Diagnosis: Acute respiratory failure with hypoxia [facility](HCC) [[id number]]  Admitting Physician: Charlsie QuestPATEL, VISHAL R [[id number]]  Attending Physician: Charlsie QuestPATEL, VISHAL R [[id number]]  Estimated length of stay: past midnight tomorrow  Certification:: I certify this patient will need inpatient services for at least 2 midnights  Bed request comments: covid-19 rule out, low risk  PT Class (Do Not Modify): Inpatient [101]  PT Acc Code (Do Not Modify): Private [1]       B Medical/Surgery History Past Medical History:  Diagnosis Date  . Enlarged prostate    History reviewed. No pertinent surgical history.   A IV Location/Drains/Wounds Patient Lines/Drains/Airways Status   Active Line/Drains/Airways    Name:   Placement date:   Placement time:   Site:   Days:   Peripheral IV 03/08/17 Right Antecubital   03/08/17    1646    Antecubital   566   Peripheral IV 09/25/18 Right Antecubital   09/25/18    2020    Antecubital   less than 1          Intake/Output Last 24 hours No intake or output data in the 24 hours ending 09/25/18 2116  Labs/Imaging Results for orders placed or performed during  the hospital encounter of 09/25/18 (from the past 48 hour(s))  Protime-INR     Status: None   Collection Time: 09/25/18  6:04 PM  Result Value Ref Range   Prothrombin Time 13.1 11.4 - 15.2 seconds   INR 1.0 0.8 - 1.2    Comment: (NOTE) INR goal varies based on device and disease states. Performed at [facility], [address] [place], [place] [address]   APTT     Status: None   Collection Time: 09/25/18  6:04 PM  Result Value Ref Range   aPTT 27 24 - 36 seconds    Comment: Performed at [facility], [address] [place], [place] [address]  CBC     Status: Abnormal   Collection Time: 09/25/18  6:04 PM  Result Value Ref Range   WBC 9.2 4.0 - 10.5 K/uL   RBC 6.14 (H) 4.22 - 5.81 MIL/uL   Hemoglobin 15.5 13.0 - 17.0 g/dL   HCT 29.547.9 28.439.0 - 13.252.0 %   MCV 78.0 (L) 80.0 - 100.0 fL   MCH 25.2 (L) 26.0 - 34.0 pg   MCHC 32.4 30.0 - 36.0 g/dL   RDW 44.014.1 10.211.5 - 72.515.5 %   Platelets 215 150 - 400 K/uL   nRBC 0.0 0.0 - 0.2 %    Comment: Performed at [facility], [address] [place], [place] [address]  Differential  Status: None   Collection Time: 09/25/18  6:04 PM  Result Value Ref Range   Neutrophils Relative % 80 %   Neutro Abs 7.4 1.7 - 7.7 K/uL   Lymphocytes Relative 15 %   Lymphs Abs 1.3 0.7 - 4.0 K/uL   Monocytes Relative 5 %   Monocytes Absolute 0.5 0.1 - 1.0 K/uL   Eosinophils Relative 0 %   Eosinophils Absolute 0.0 0.0 - 0.5 K/uL   Basophils Relative 0 %   Basophils Absolute 0.0 0.0 - 0.1 K/uL   Immature Granulocytes 0 %   Abs Immature Granulocytes 0.03 0.00 - 0.07 K/uL    Comment: Performed at [facility], [address] [place], [place] [address]  Comprehensive metabolic panel     Status: Abnormal   Collection Time: 09/25/18  6:04 PM  Result Value Ref Range   Sodium 140 135 - 145 mmol/L   Potassium 4.2 3.5 - 5.1 mmol/L   Chloride 105 98 - 111 mmol/L   CO2 26 22 - 32 mmol/L   Glucose, Bld 146 (H) 70 - 99 mg/dL   BUN 19 8 - 23 mg/dL    Creatinine, Ser 6.04 0.61 - 1.24 mg/dL   Calcium 8.7 (L) 8.9 - 10.3 mg/dL   Total Protein 8.2 (H) 6.5 - 8.1 g/dL   Albumin 3.3 (L) 3.5 - 5.0 g/dL   AST 540 (H) 15 - 41 U/L   ALT 139 (H) 0 - 44 U/L   Alkaline Phosphatase 60 38 - 126 U/L   Total Bilirubin 0.6 0.3 - 1.2 mg/dL   GFR calc non Af Amer >60 >60 mL/min   GFR calc Af Amer >60 >60 mL/min   Anion gap 9 5 - 15    Comment: Performed at [facility], [address], [place], [place] [address]  Lactic acid, plasma     Status: None   Collection Time: 09/25/18  8:22 PM  Result Value Ref Range   Lactic Acid, Venous 1.7 0.5 - 1.9 mmol/L    Comment: Performed at [facility], [address], [place], [place] [address]  I-Stat Troponin, ED (not at [facility])     Status: None   Collection Time: 09/25/18  8:42 PM  Result Value Ref Range   Troponin i, poc 0.01 0.00 - 0.08 ng/mL   Comment 3            Comment: Due to the release kinetics of cTnI, a negative result within the first hours of the onset of symptoms does not rule out myocardial infarction with certainty. If myocardial infarction is still suspected, repeat the test at appropriate intervals.    Ct Head Wo Contrast  Result Date: 09/25/2018 CLINICAL DATA:  Slurred speech and altered mental status EXAM: CT HEAD WITHOUT CONTRAST TECHNIQUE: Contiguous axial images were obtained from the base of the skull through the vertex without intravenous contrast. COMPARISON:  None. FINDINGS: Brain: There is mild age related volume loss. There is no intracranial mass, hemorrhage, extra-axial fluid collection, or midline shift. There is slight small vessel disease in the centra semiovale bilaterally. Brain parenchyma otherwise appears unremarkable. No acute infarct evident. Vascular: There is no hyperdense vessel. There is calcification in each carotid siphon region. Skull: The bony calvarium appears intact. Sinuses/Orbits: Paranasal sinuses are clear. Orbits appear symmetric bilaterally.  Other: Mastoid air cells are clear. IMPRESSION: Mild age related volume loss with slight periventricular small vessel disease. No acute infarct. No mass or hemorrhage. There are  foci of arterial vascular calcification. Electronically Signed   By: Bretta Bang III M.D.   On: 09/25/2018 18:53   Dg Chest Portable 1 View  Result Date: 09/25/2018 CLINICAL DATA:  Shortness of breath with cough and fever EXAM: PORTABLE CHEST 1 VIEW COMPARISON:  None. FINDINGS: There is patchy airspace opacity in the right mid lung and both base regions. Heart is upper normal in size with pulmonary vascularity normal. No adenopathy. No bone lesions. IMPRESSION: Multifocal pneumonia with patchy airspace opacity in both lung bases and right mid lung regions. Atypical pneumonia including viral pneumonia can present in this manner. No adenopathy evident. Electronically Signed   By: Bretta Bang III M.D.   On: 09/25/2018 20:01    Pending Labs Unresulted Labs (From admission, onward)    Start     Ordered   09/25/18 1939  Lactic acid, plasma  Now then every 2 hours,   STAT     09/25/18 1940   09/25/18 1939  Blood culture (routine x 2)  BLOOD CULTURE X 2,   STAT     09/25/18 1940          Vitals/Pain Today's Vitals   09/25/18 1930 09/25/18 2019 09/25/18 2027 09/25/18 2042  BP: 135/69 (!) 144/82    Pulse: 88 91 87 87  Resp: (!) 28 (!) 35 (!) 24 (!) 25  Temp:      TempSrc:      SpO2: 94% 91% (!) 89% 96%  PainSc:        Isolation Precautions No active isolations  Medications Medications  sodium chloride flush (NS) 0.9 % injection 3 mL (3 mLs Intravenous Not Given 09/25/18 2029)  azithromycin (ZITHROMAX) 500 mg in sodium chloride 0.9 % 250 mL IVPB (500 mg Intravenous New Bag/Given 09/25/18 2101)  cefTRIAXone (ROCEPHIN) 1 g in sodium chloride 0.9 % 100 mL IVPB (0 g Intravenous Stopped 09/25/18 2100)  ibuprofen (ADVIL,MOTRIN) tablet 400 mg (400 mg Oral Given 09/25/18 2044)    Mobility walks Low fall risk    Focused Assessments Neuro Assessment Handoff:  Swallow screen pass? Yes    NIH Stroke Scale ( + Modified Stroke Scale Criteria)  Interval: Initial Level of Consciousness (1a.)   : Alert, keenly responsive LOC Questions (1b. )   +: Answers both questions correctly LOC Commands (1c. )   + : Performs both tasks correctly Best Gaze (2. )  +: Normal Visual (3. )  +: No visual loss Facial Palsy (4. )    : Normal symmetrical movements Motor Arm, Left (5a. )   +: No drift Motor Arm, Right (5b. )   +: No drift Motor Leg, Left (6a. )   +: No drift Motor Leg, Right (6b. )   +: No drift Limb Ataxia (7. ): Absent Sensory (8. )   +: Normal, no sensory loss Best Language (9. )   +: Severe aphasia Dysarthria (10. ): Mild-to-moderate dysarthria, patient slurs at least some words and, at worst, can be understood with some difficulty Extinction/Inattention (11.)   +: No Abnormality Modified SS Total  +: 2 Complete NIHSS TOTAL: 3 Last date known well: 09/24/18 Last time known well: 1700 Neuro Assessment:   Neuro Checks:   Initial (09/25/18 2003)  Last Documented NIHSS Modified Score: 2 (09/25/18 2003) Has TPA been given? No If patient is a Neuro Trauma and patient is going to OR before floor call report to 4N Charge nurse: [phone] or [phone]     R Recommendations: See Admitting  Provider Note  Report given to:   Additional Notes: Pts wife is being treated for pneumonia at home, pt saw PCP yesterday and was given oral abx to take, has not been able to get them filled yet. Pt was tested for COVID-19 at PCP office, mild non productive cough. Rectal temp was 101.4, ibuprofen given in ED. Lung sounds course crackles bilaterally. Pt alert, oriented x3. Pts aphasia has improved since arriving to ED.

## 2018-09-25 NOTE — ED Notes (Signed)
Pt placed on 3L [place] due to sats dropping to 88% while on room air

## 2018-09-25 NOTE — ED Notes (Signed)
Delay in hanging zithromax due to the medication is not compatible with rocephin and this RN attempted to get a 2nd iv access twice without success

## 2018-09-25 NOTE — ED Notes (Signed)
Pt's dtr Selena at [phone] (Primary). Pt's Dtr Luna Glasgow Ph# [phone]. Per dtr Pt is HOH.

## 2018-09-25 NOTE — ED Notes (Signed)
Per Pt's Dtr. Salena pt was seen at PCP Armando Gang at [facility] today. 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

## 2018-09-25 NOTE — ED Provider Notes (Signed)
[facility] EMERGENCY DEPARTMENT Provider Note   CSN: [id number] Arrival date & time: 09/25/18  1751    History   Chief Complaint Chief Complaint  Patient presents with   Weakness   Aphasia    HPI Steven Jimenez is a 73 y.o. male.     The history is provided by a relative, the patient and medical records (daughter). No language interpreter was used.  Neurologic Problem  This is a new problem. The current episode started 2 days ago. The problem occurs constantly. The problem has been gradually worsening. Associated symptoms include shortness of breath. Pertinent negatives include no chest pain, no abdominal pain and no headaches. Nothing aggravates the symptoms. Nothing relieves the symptoms. He has tried nothing for the symptoms. The treatment provided no relief.  Cough  Cough characteristics:  Productive Sputum characteristics:  White Severity:  Moderate Onset quality:  Gradual Duration:  3 days Timing:  Intermittent Progression:  Waxing and waning Chronicity:  New Context: sick contacts (wife with PNA)   Relieved by:  Nothing Worsened by:  Nothing Associated symptoms: chills, fever and shortness of breath   Associated symptoms: no chest pain, no diaphoresis and no headaches     Past Medical History:  Diagnosis Date   Enlarged prostate     There are no active problems to display for this patient.   History reviewed. No pertinent surgical history.      Home Medications    Prior to Admission medications   Medication Sig Start Date End Date Taking? Authorizing Provider  HYDROcodone-acetaminophen (NORCO/VICODIN) 5-325 MG tablet Take 1 tablet by mouth every 6 (six) hours as needed for severe pain. 03/09/17   Zadie Rhine, MD  phenazopyridine (PYRIDIUM) 97 MG tablet Take 97 mg by mouth daily.    [provider]    Family History History reviewed. No pertinent family history.  Social History Social History   Tobacco Use    Smoking status: Never Smoker   Smokeless tobacco: Never Used  Substance Use Topics   Alcohol use: Yes    Comment: occ   Drug use: No     Allergies   Patient has no known allergies.   Review of Systems Review of Systems  Unable to perform ROS: Patient nonverbal  Constitutional: Positive for chills, fatigue and fever. Negative for diaphoresis.  HENT: Negative for congestion.   Respiratory: Positive for cough and shortness of breath. Negative for choking and chest tightness.   Cardiovascular: Negative for chest pain.  Gastrointestinal: Negative for abdominal pain, constipation, diarrhea, nausea and vomiting.  Musculoskeletal: Negative for back pain, neck pain and neck stiffness.  Neurological: Positive for speech difficulty. Negative for weakness, light-headedness and headaches.  Psychiatric/Behavioral: Negative for agitation.     Physical Exam Updated Vital Signs BP 132/78 (BP Location: Left Arm)    Pulse 90    Temp 99.1 F (37.3 C) (Oral)    Resp 18    SpO2 90%   Physical Exam Vitals signs and nursing note reviewed.  Constitutional:      General: He is not in acute distress.    Appearance: He is well-developed. He is not ill-appearing, toxic-appearing or diaphoretic.  HENT:     Head: Normocephalic and atraumatic.     Nose: No congestion or rhinorrhea.     Mouth/Throat:     Pharynx: No oropharyngeal exudate or posterior oropharyngeal erythema.  Eyes:     Conjunctiva/sclera: Conjunctivae normal.     Pupils: Pupils are equal, round, and reactive  to light.  Neck:     Musculoskeletal: Neck supple. No muscular tenderness.  Cardiovascular:     Rate and Rhythm: Normal rate and regular rhythm.     Heart sounds: No murmur.  Pulmonary:     Effort: Tachypnea present. No respiratory distress.     Breath sounds: Rhonchi present. No wheezing or rales.  Chest:     Chest wall: No tenderness.  Abdominal:     Palpations: Abdomen is soft.     Tenderness: There is no abdominal  tenderness.  Musculoskeletal:        General: No tenderness.     Right lower leg: No edema.     Left lower leg: No edema.  Skin:    General: Skin is warm and dry.     Capillary Refill: Capillary refill takes less than 2 seconds.     Findings: No erythema.  Neurological:     Mental Status: He is alert.     Sensory: No sensory deficit.     Motor: No weakness.     Coordination: Coordination normal. Finger-Nose-Finger Test normal.     Gait: Gait normal.     Comments: Patient had expressive aphasia and could not answer any questions aside from occasional one-word answers.  Per daughter this is new over the last 2 days.  Otherwise normal sensation and strength in extremities.  No facial droop.  Normal extraocular movements.  Normal finger-nose-finger testing.  Slow but steady gait.  Psychiatric:        Mood and Affect: Mood normal.      ED Treatments / Results  Labs (all labs ordered are listed, but only abnormal results are displayed) Labs Reviewed  CBC - Abnormal; Notable for the following components:      Result Value   RBC 6.14 (*)    MCV 78.0 (*)    MCH 25.2 (*)    All other components within normal limits  COMPREHENSIVE METABOLIC PANEL - Abnormal; Notable for the following components:   Glucose, Bld 146 (*)    Calcium 8.7 (*)    Total Protein 8.2 (*)    Albumin 3.3 (*)    AST 104 (*)    ALT 139 (*)    All other components within normal limits  SEDIMENTATION RATE - Abnormal; Notable for the following components:   Sed Rate 26 (*)    All other components within normal limits  LACTATE DEHYDROGENASE - Abnormal; Notable for the following components:   LDH 394 (*)    All other components within normal limits  HEMOGLOBIN A1C - Abnormal; Notable for the following components:   Hgb A1c MFr Bld 7.3 (*)    All other components within normal limits  CULTURE, BLOOD (ROUTINE X 2)  CULTURE, BLOOD (ROUTINE X 2)  NOVEL CORONAVIRUS, NAA (HOSPITAL ORDER, SEND-OUT TO REF LAB)   RESPIRATORY PANEL BY PCR  PROTIME-INR  APTT  DIFFERENTIAL  LACTIC ACID, PLASMA  PROCALCITONIN  INFLUENZA PANEL BY PCR (TYPE A & B)  CBC WITH DIFFERENTIAL/PLATELET  COMPREHENSIVE METABOLIC PANEL  LEGIONELLA PNEUMOPHILA SEROGP 1 UR AG  STREP PNEUMONIAE URINARY ANTIGEN  LIPID PANEL  C-REACTIVE PROTEIN  I-STAT TROPONIN, ED    EKG EKG Interpretation  Date/Time:  Monday September 25 2018 16:56:52 EDT Ventricular Rate:  95 PR Interval:  156 QRS Duration: 132 QT Interval:  382 QTC Calculation: 480 R Axis:   -63 Text Interpretation:  Normal sinus rhythm 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  No STEMI Confirmed by Theda Belfast ([id number]) on 09/25/2018 9:05:30 PM   Radiology Ct Head Wo Contrast  Result Date: 09/25/2018 CLINICAL DATA:  Slurred speech and altered mental status EXAM: CT HEAD WITHOUT CONTRAST TECHNIQUE: Contiguous axial images were obtained from the base of the skull through the vertex without intravenous contrast. COMPARISON:  None. FINDINGS: Brain: There is mild age related volume loss. There is no intracranial mass, hemorrhage, extra-axial fluid collection, or midline shift. There is slight small vessel disease in the centra semiovale bilaterally. Brain parenchyma otherwise appears unremarkable. No acute infarct evident. Vascular: There is no hyperdense vessel. There is calcification in each carotid siphon region. Skull: The bony calvarium appears intact. Sinuses/Orbits: Paranasal sinuses are clear. Orbits appear symmetric bilaterally. Other: Mastoid air cells are clear. IMPRESSION: Mild age related volume loss with slight periventricular small vessel disease. No acute infarct. No mass or hemorrhage. There are foci of arterial vascular calcification. Electronically Signed   By: Bretta Bang III M.D.    On: 09/25/2018 18:53   Dg Chest Portable 1 View  Result Date: 09/25/2018 CLINICAL DATA:  Shortness of breath with cough and fever EXAM: PORTABLE CHEST 1 VIEW COMPARISON:  None. FINDINGS: There is patchy airspace opacity in the right mid lung and both base regions. Heart is upper normal in size with pulmonary vascularity normal. No adenopathy. No bone lesions. IMPRESSION: Multifocal pneumonia with patchy airspace opacity in both lung bases and right mid lung regions. Atypical pneumonia including viral pneumonia can present in this manner. No adenopathy evident. Electronically Signed   By: Bretta Bang III M.D.   On: 09/25/2018 20:01    Procedures Procedures (including critical care time)  CRITICAL CARE Performed by: Canary Brim Dajana Gehrig Total critical care time: 45 minutes Critical care time was exclusive of separately billable procedures and treating other patients. Critical care was necessary to treat or prevent imminent or life-threatening deterioration. Critical care was time spent personally by me on the following activities: development of treatment plan with patient and/or surrogate as well as nursing, discussions with consultants, evaluation of patient's response to treatment, examination of patient, obtaining history from patient or surrogate, ordering and performing treatments and interventions, ordering and review of laboratory studies, ordering and review of radiographic studies, pulse oximetry and re-evaluation of patient's condition.   Medications Ordered in ED Medications  sodium chloride flush (NS) 0.9 % injection 3 mL (3 mLs Intravenous Not Given 09/25/18 2029)  cefTRIAXone (ROCEPHIN) 1 g in sodium chloride 0.9 % 100 mL IVPB (has no administration in time range)  azithromycin (ZITHROMAX) 500 mg in sodium chloride 0.9 % 250 mL IVPB (has no administration in time range)  heparin injection 5,000 Units (has no administration in time range)  sodium chloride flush (NS) 0.9 %  injection 3 mL (has no administration in time range)  0.9 %  sodium chloride infusion (has no administration in time range)  acetaminophen (TYLENOL) tablet 650 mg (has no administration in time range)  ondansetron (ZOFRAN) tablet 4 mg (has no administration in time range)    Or  ondansetron (ZOFRAN) injection 4 mg (has no administration in time range)  aspirin EC tablet 81 mg (has no administration in time range)  cefTRIAXone (ROCEPHIN) 1 g in sodium chloride 0.9 % 100 mL IVPB (0 g Intravenous Stopped 09/25/18 2100)  azithromycin (ZITHROMAX) 500 mg in sodium chloride 0.9 % 250 mL IVPB (  0 mg Intravenous Stopped 09/25/18 2202)  ibuprofen (ADVIL,MOTRIN) tablet 400 mg (400 mg Oral Given 09/25/18 2044)     Initial Impression / Assessment and Plan / ED Course  I have reviewed the triage vital signs and the nursing notes.  Pertinent labs & imaging results that were available during my care of the patient were reviewed by me and considered in my medical decision making (see chart for details).        Jory Tanguma is a 73 y.o. male with a history of enlarged prostate who presents with fevers, chills, productive cough, fatigue, shortness of breath, as well as 2 days of difficulty with speech and gait.  According to patient's daughter who provides information because patient is unable to speak, patient's been taking care of his wife who has pneumonia for the last week.  She reports that patient started feeling bad 2 or 3 days ago and has been having productive cough.  Patient is also been complaining of some chills, subjective fevers, and fatigue.  Patient saw PCP today who reports that x-ray shows right-sided pneumonia on the patient.  Patient also had negative flu test.  Patient had a coronavirus test sent which is still in process.  The PCP was concerned about stroke as the patient also has been unable to speak for the last 2 days.  Patient initially was having some slurred speech per daughter but is  now not able to speak whatsoever.  Daughter was also concerned about patient feeling unsteady and unable to ambulate.  She reports that he has nearly fallen several times but he did not have any focal weakness on the PCPs exam.  PCP told daughter that he may have some finger-nose-finger testing difficulties however I did not see that initially.  On arrival, patient already had a head CT and labs performed from triage given the concern for speech abnormality.  On my assessment, patient was warm to the touch, rectal temperature was one 1.4.  Patient was breathing with a respiratory in the 30s and oxygen saturation was approximately 91% on room air.  Patient had some rhonchi in his bilateral bases of the lungs.  Patient's chest and abdomen were nontender.  Patient had normal strength and sensation in all extremities and had normal finger-nose-finger testing on both arms.  Patient had no facial droop and had normal sensation throughout.  Normal extraocular movements and pupil exam.  Patient would not speak and was unable to speak any words.  I ambulated patient personally and he had a slow gait with reported unsteadiness.  He did not fall and had a negative Romberg.  Clinically I am concerned about 2 issues.  I am concerned about the expressive aphasia which is gone on for nearly 2 days.  Daughter reports no history of stroke however I am concerned patient may have had a stroke.  He is van negative.  I am also concerned about the pneumonia and possible coronavirus.  Will have repeat x-ray given his coughing to make sure there is not something like a pneumothorax or other emergent change in the pneumonia.  He will have labs, blood cultures, and lactic acid.  Given his oxygen saturations in the low 90s, his increased work of breathing, his age, and the pneumonia, patient may require admission for both possible stroke and the pneumonia.  PPE droplet and contact barrier precautions were utilized on my initial  evaluation going forward.   Spoke with neurology who recommended MRI without contrast of the brain.  After x-ray, will likely order antibiotics for pneumonia.  8:19 PM Oxygen saturation is now 90% on room air.  Patient still tachypneic.  X-ray was completed now shows multifocal bilateral pneumonia versus just the right-sided pneumonia reportedly seen earlier today.  Had extended conversation with patient's daughter.  She agrees with admission for the patient if it is necessary for him.  Given his pneumonia, rule out coronavirus, and soft oxygen saturations with no history of home oxygen use, anticipate will need admission for this as well as the possible stroke.  LFTs are slightly elevated, will hold on Tylenol for fever treatment and will instead give Motrin.  Patient will be given antibiotics for Community associated pneumonia with the multifocal pneumonia.  8:33 PM Patient was reassessed by me and his oxygen saturation was now 88 on room air.  Patient was placed on 3 L nasal cannula and is now 95%.  Patient will remain on oxygen.  Due to the new oxygen requirement with the multifocal pneumonia that is worsening with cough, patient will be admitted to hospital service.  Antibiotics ordered.  We will await MRI and further neurology recommendations for the possible stroke.  Patient was able to answer some questions with one-word answers which is improved from when I first evaluated the patient.  Hospitalist team will admit for further management of his pneumonia with hypoxia, rule out coronavirus, and work-up of possible stroke.   Final Clinical Impressions(s) / ED Diagnoses   Final diagnoses:  Multifocal pneumonia  Hypoxia  Fever, unspecified fever cause  Cough  Expressive aphasia    ED Discharge Orders    None     Clinical Impression: 1. Multifocal pneumonia   2. Hypoxia   3. Fever, unspecified fever cause   4. Cough   5. Expressive aphasia     Disposition: Admit  This  note was prepared with assistance of Dragon voice recognition software. Occasional wrong-word or sound-a-like substitutions may have occurred due to the inherent limitations of voice recognition software.     Ciria Bernardini, Canary Brim, MD 09/25/18 [phone]

## 2018-09-25 NOTE — ED Triage Notes (Signed)
Pt here for slurred speech and generalized weakness for >24 hrs.  Pt reports yesterday afternoon before dinner was the last he felt normal. Weakness is generalized no specific to a limb or a side.  A&Ox4. VAN neg

## 2018-09-26 DIAGNOSIS — R4701 Aphasia: Secondary | ICD-10-CM

## 2018-09-26 DIAGNOSIS — J189 Pneumonia, unspecified organism: Secondary | ICD-10-CM

## 2018-09-26 LAB — RESPIRATORY PANEL BY PCR
Adenovirus: NOT DETECTED
Bordetella pertussis: NOT DETECTED
CORONAVIRUS HKU1-RVPPCR: NOT DETECTED
Chlamydophila pneumoniae: NOT DETECTED
Coronavirus 229E: NOT DETECTED
Coronavirus NL63: NOT DETECTED
Coronavirus OC43: NOT DETECTED
Influenza A: NOT DETECTED
Influenza B: NOT DETECTED
Metapneumovirus: NOT DETECTED
Mycoplasma pneumoniae: NOT DETECTED
PARAINFLUENZA VIRUS 1-RVPPCR: NOT DETECTED
PARAINFLUENZA VIRUS 3-RVPPCR: NOT DETECTED
Parainfluenza Virus 2: NOT DETECTED
Parainfluenza Virus 4: NOT DETECTED
Respiratory Syncytial Virus: NOT DETECTED
Rhinovirus / Enterovirus: NOT DETECTED

## 2018-09-26 LAB — COMPREHENSIVE METABOLIC PANEL
ALBUMIN: 3 g/dL — AB (ref 3.5–5.0)
ALK PHOS: 55 U/L (ref 38–126)
ALT: 130 U/L — ABNORMAL HIGH (ref 0–44)
AST: 104 U/L — ABNORMAL HIGH (ref 15–41)
Anion gap: 11 (ref 5–15)
BUN: 22 mg/dL (ref 8–23)
CO2: 23 mmol/L (ref 22–32)
Calcium: 8.2 mg/dL — ABNORMAL LOW (ref 8.9–10.3)
Chloride: 105 mmol/L (ref 98–111)
Creatinine, Ser: 1.11 mg/dL (ref 0.61–1.24)
GFR calc Af Amer: >60 mL/min (ref >60)
GFR calc non Af Amer: >60 mL/min (ref >60)
Glucose, Bld: 125 mg/dL — ABNORMAL HIGH (ref 70–99)
Potassium: 4.1 mmol/L (ref 3.5–5.1)
Sodium: 139 mmol/L (ref 135–145)
Total Bilirubin: 0.8 mg/dL (ref 0.3–1.2)
Total Protein: 7.1 g/dL (ref 6.5–8.1)

## 2018-09-26 LAB — CBC WITH DIFFERENTIAL/PLATELET
ABS IMMATURE GRANULOCYTES: 0.04 10*3/uL (ref 0.00–0.07)
Basophils Absolute: 0 10*3/uL (ref 0.0–0.1)
Basophils Relative: 0 %
Eosinophils Absolute: 0.1 10*3/uL (ref 0.0–0.5)
Eosinophils Relative: 1 %
HCT: 43.5 % (ref 39.0–52.0)
HEMOGLOBIN: 14.5 g/dL (ref 13.0–17.0)
Immature Granulocytes: 0 %
Lymphocytes Relative: 14 %
Lymphs Abs: 1.4 10*3/uL (ref 0.7–4.0)
MCH: 26.2 pg (ref 26.0–34.0)
MCHC: 33.3 g/dL (ref 30.0–36.0)
MCV: 78.5 fL — ABNORMAL LOW (ref 80.0–100.0)
Monocytes Absolute: 0.4 10*3/uL (ref 0.1–1.0)
Monocytes Relative: 4 %
NEUTROS ABS: 7.6 10*3/uL (ref 1.7–7.7)
NEUTROS PCT: 81 %
Platelets: 212 10*3/uL (ref 150–400)
RBC: 5.54 MIL/uL (ref 4.22–5.81)
RDW: 14.2 % (ref 11.5–15.5)
WBC: 9.5 10*3/uL (ref 4.0–10.5)
nRBC: 0 % (ref 0.0–0.2)

## 2018-09-26 LAB — INFLUENZA PANEL BY PCR (TYPE A & B)
Influenza A By PCR: NEGATIVE
Influenza B By PCR: NEGATIVE

## 2018-09-26 LAB — STREP PNEUMONIAE URINARY ANTIGEN: Strep Pneumo Urinary Antigen: NEGATIVE

## 2018-09-26 LAB — C-REACTIVE PROTEIN: CRP: 7.5 mg/dL — ABNORMAL HIGH (ref <1.0)

## 2018-09-26 LAB — LIPID PANEL
Cholesterol: 175 mg/dL (ref 0–200)
HDL: 29 mg/dL — ABNORMAL LOW (ref >40)
LDL CALC: 112 mg/dL — AB (ref 0–99)
Total CHOL/HDL Ratio: 6 RATIO
Triglycerides: 172 mg/dL — ABNORMAL HIGH (ref <150)
VLDL: 34 mg/dL (ref 0–40)

## 2018-09-26 NOTE — Progress Notes (Signed)
SLP Cancellation Note  Patient Details Name: Steven Jimenez MRN: [id number] DOB: July 18, 1945   Cancelled treatment:       Reason Eval/Treat Not Completed: Medical issues which prohibited therapy. Per health system leadership, therapy services are being held at this time until patient tests negative for COVID-19.    Harlon Ditty, MA CCC-SLP  Acute Rehabilitation Services Pager [phone] Office [phone]   [name] 09/26/2018, 9:37 AM

## 2018-09-26 NOTE — Progress Notes (Addendum)
OT Cancellation Note  Patient Details Name: Steven Jimenez MRN: [id number] DOB: Nov 21, 1945   Cancelled Treatment:    Reason Eval/Treat Not Completed: Patient not medically ready. Per health system leadership, therapy services are being held at this time until patient tests negative for COVID-19.     Chancy Milroy, OT Acute Rehabilitation Services Pager [phone] Office [phone]   Chancy Milroy 09/26/2018, 9:09 AM

## 2018-09-26 NOTE — Progress Notes (Signed)
PT Cancellation Note  Patient Details Name: Steven Jimenez MRN: [id number] DOB: 1946-05-26   Cancelled Treatment:    Reason Eval/Treat Not Completed: Patient not medically ready(covid results pending)   Lyanne Co, PT  Acute Rehab Services  Pager [phone] Office [phone]    Lawana Chambers Canna Nickelson 09/26/2018, 8:51 AM

## 2018-09-26 NOTE — Progress Notes (Signed)
PROGRESS NOTE    Steven Jimenez   ZOX:[id number]  DOB: [birth year]  DOA: 09/25/2018 PCP: Patient, No Pcp Per   Brief Narrative:  Steven Jimenez is a 73 y/o male with BPH who presented due to slurred speech which progressed to aphasia and generalized weakness. His daughter made him come to the ED.  In the ED, he was described as being aphasic (by ED doctor's note).  Pulse ox was 88% and he was placed on oxygen.  CXR > Multifocal pneumonia with patchy airspace opacity in both lung bases and right mid lung regions Head CT> No acute infarct. No mass or hemorrhage. He was started on Ceftriaxone and Azithromycin.   Subjective: He states that his wife has been sick with pneumonia and has not been wearing a mask. He has no complaints today and appears to be speaking normally. He cannot recall why  He is in the hospital and states that it is only because his daughter forced him to come.     Assessment & Plan:   Principal Problem:   Acute respiratory failure with hypoxia  /   Multifocal pneumonia -cont Azithromycin and Ceftriaxone and wean O2 - COVID 19 test is pending  Active Problems:    Aphasia - CT head negative - has resolved- MRI not able to be done until he has a negative COVID 19 test - for now, continue Aspirin 81 mg daily for stroke prevention - cont telemetry while in hospital  Time spent in minutes: 35 DVT prophylaxis:  Heparin Code Status: Full code Family Communication: daughter Disposition Plan: home in 1-2 days Consultants:   none Procedures:   none Antimicrobials:  Anti-infectives (From admission, onward)   Start     Dose/Rate Route Frequency Ordered Stop   09/26/18 2000  cefTRIAXone (ROCEPHIN) 1 g in sodium chloride 0.9 % 100 mL IVPB     1 g 200 mL/hr over 30 Minutes Intravenous Every 24 hours 09/25/18 2128     09/26/18 2000  azithromycin (ZITHROMAX) 500 mg in sodium chloride 0.9 % 250 mL IVPB     500 mg 250 mL/hr over 60 Minutes Intravenous Every 24  hours 09/25/18 2128     09/25/18 2030  cefTRIAXone (ROCEPHIN) 1 g in sodium chloride 0.9 % 100 mL IVPB     1 g 200 mL/hr over 30 Minutes Intravenous  Once 09/25/18 2021 09/25/18 2100   09/25/18 2030  azithromycin (ZITHROMAX) 500 mg in sodium chloride 0.9 % 250 mL IVPB     500 mg 250 mL/hr over 60 Minutes Intravenous  Once 09/25/18 2021 09/25/18 2202       Objective: Vitals:   09/25/18 2130 09/25/18 2245 09/26/18 0024 09/26/18 0310  BP: 123/80  122/75   Pulse: 86  74 74  Resp: (!) 29     Temp:   97.7 F (36.5 C) 97.7 F (36.5 C)  TempSrc:   Oral Oral  SpO2: 95%  97%   Weight:  72 kg    Height:   (1.651 m)      Intake/Output Summary (Last 24 hours) at 09/26/2018 1427 Last data filed at 09/26/2018 0300 Gross per 24 hour  Intake 216.32 ml  Output -  Net 216.32 ml   Filed Weights   09/25/18 2245  Weight: 72 kg    Examination: General exam: Appears comfortable  HEENT: PERRLA, oral mucosa moist, no sclera icterus or thrush Respiratory system: poor breath sounds at bases- Respiratory effort normal. Cardiovascular system: S1 & S2 heard, RRR.  Gastrointestinal system: Abdomen soft, non-tender, nondistended. Normal bowel sounds. Central nervous system: Alert and oriented. No focal neurological deficits. Extremities: No cyanosis, clubbing or edema Skin: No rashes or ulcers Psychiatry:  Mood & affect appropriate.   Data Reviewed: I have personally reviewed following labs and imaging studies  CBC: Recent Labs  Lab 09/25/18 1804 09/26/18 0214  WBC 9.2 9.5  NEUTROABS 7.4 7.6  HGB 15.5 14.5  HCT 47.9 43.5  MCV 78.0* 78.5*  PLT 215 212   Basic Metabolic Panel: Recent Labs  Lab 09/25/18 1804 09/26/18 0214  NA 140 139  K 4.2 4.1  CL 105 105  CO2 26 23  GLUCOSE 146* 125*  BUN 19 22  CREATININE 1.06 1.11  CALCIUM 8.7* 8.2*   GFR: Estimated Creatinine Clearance: 51.6 mL/min (by C-G formula based on SCr of 1.11 mg/dL). Liver Function Tests: Recent Labs   Lab 09/25/18 1804 09/26/18 0214  AST 104* 104*  ALT 139* 130*  ALKPHOS 60 55  BILITOT 0.6 0.8  PROT 8.2* 7.1  ALBUMIN 3.3* 3.0*   No results for input(s): LIPASE, AMYLASE in the last 168 hours. No results for input(s): AMMONIA in the last 168 hours. Coagulation Profile: Recent Labs  Lab 09/25/18 1804  INR 1.0   Cardiac Enzymes: No results for input(s): CKTOTAL, CKMB, CKMBINDEX, TROPONINI in the last 168 hours. BNP (last 3 results) No results for input(s): PROBNP in the last 8760 hours. HbA1C: Recent Labs    09/25/18 2136  HGBA1C 7.3*   CBG: No results for input(s): GLUCAP in the last 168 hours. Lipid Profile: Recent Labs    09/26/18 0214  CHOL 175  HDL 29*  LDLCALC 112*  TRIG 172*  CHOLHDL 6.0   Thyroid Function Tests: No results for input(s): TSH, T4TOTAL, FREET4, T3FREE, THYROIDAB in the last 72 hours. Anemia Panel: No results for input(s): VITAMINB12, FOLATE, FERRITIN, TIBC, IRON, RETICCTPCT in the last 72 hours. Urine analysis:    Component Value Date/Time   COLORURINE YELLOW 03/08/2017 1545   APPEARANCEUR CLOUDY (A) 03/08/2017 1545   LABSPEC 1.025 03/08/2017 1545   PHURINE 6.0 03/08/2017 1545   GLUCOSEU NEGATIVE 03/08/2017 1545   HGBUR LARGE (A) 03/08/2017 1545   BILIRUBINUR NEGATIVE 03/08/2017 1545   KETONESUR NEGATIVE 03/08/2017 1545   PROTEINUR NEGATIVE 03/08/2017 1545   NITRITE POSITIVE (A) 03/08/2017 1545   LEUKOCYTESUR LARGE (A) 03/08/2017 1545   Sepsis Labs: @LABRCNTIP (procalcitonin:4,lacticidven:4) ) Recent Results (from the past 240 hour(s))  Blood culture (routine x 2)     Status: None (Preliminary result)   Collection Time: 09/25/18  8:14 PM  Result Value Ref Range Status   Specimen Description BLOOD RIGHT ANTECUBITAL  Final   Special Requests   Final    BOTTLES DRAWN AEROBIC AND ANAEROBIC Blood Culture adequate volume   Culture   Final    NO GROWTH < 24 HOURS Performed at [facility], [address] [place], [place]  [address]    Report Status PENDING  Incomplete  Blood culture (routine x 2)     Status: None (Preliminary result)   Collection Time: 09/25/18  8:16 PM  Result Value Ref Range Status   Specimen Description BLOOD LEFT FOREARM  Final   Special Requests   Final    BOTTLES DRAWN AEROBIC ONLY Blood Culture results may not be optimal due to an inadequate volume of blood received in culture bottles   Culture   Final    NO GROWTH < 24 HOURS Performed at [facility], [address], [place], [place] [address]    Report Status PENDING  Incomplete  Respiratory Panel by PCR     Status: None   Collection Time: 09/26/18  5:36 AM  Result Value Ref Range Status   Adenovirus NOT DETECTED NOT DETECTED Final   Coronavirus 229E NOT DETECTED NOT DETECTED Final    Comment: (NOTE) The Coronavirus on the Respiratory Panel, DOES NOT test for the novel  Coronavirus (2019 nCoV)    Coronavirus HKU1 NOT DETECTED NOT DETECTED Final   Coronavirus NL63 NOT DETECTED NOT DETECTED Final   Coronavirus OC43 NOT DETECTED NOT DETECTED Final   Metapneumovirus NOT DETECTED NOT DETECTED Final   Rhinovirus / Enterovirus NOT DETECTED NOT DETECTED Final   Influenza A NOT DETECTED NOT DETECTED Final   Influenza B NOT DETECTED NOT DETECTED Final   Parainfluenza Virus 1 NOT DETECTED NOT DETECTED Final   Parainfluenza Virus 2 NOT DETECTED NOT DETECTED Final   Parainfluenza Virus 3 NOT DETECTED NOT DETECTED Final   Parainfluenza Virus 4 NOT DETECTED NOT DETECTED Final   Respiratory Syncytial Virus NOT DETECTED NOT DETECTED Final   Bordetella pertussis NOT DETECTED NOT DETECTED Final   Chlamydophila pneumoniae NOT DETECTED NOT DETECTED Final   Mycoplasma pneumoniae NOT DETECTED NOT DETECTED Final    Comment: Performed at [facility], [address], [place], [place] [address]         Radiology Studies: Ct Head Wo Contrast  Result Date: 09/25/2018 CLINICAL DATA:  Slurred speech and altered mental status EXAM:  CT HEAD WITHOUT CONTRAST TECHNIQUE: Contiguous axial images were obtained from the base of the skull through the vertex without intravenous contrast. COMPARISON:  None. FINDINGS: Brain: There is mild age related volume loss. There is no intracranial mass, hemorrhage, extra-axial fluid collection, or midline shift. There is slight small vessel disease in the centra semiovale bilaterally. Brain parenchyma otherwise appears unremarkable. No acute infarct evident. Vascular: There is no hyperdense vessel. There is calcification in each carotid siphon region. Skull: The bony calvarium appears intact. Sinuses/Orbits: Paranasal sinuses are clear. Orbits appear symmetric bilaterally. Other: Mastoid air cells are clear. IMPRESSION: Mild age related volume loss with slight periventricular small vessel disease. No acute infarct. No mass or hemorrhage. There are foci of arterial vascular calcification. Electronically Signed   By: Bretta Bang III M.D.   On: 09/25/2018 18:53   Dg Chest Portable 1 View  Result Date: 09/25/2018 CLINICAL DATA:  Shortness of breath with cough and fever EXAM: PORTABLE CHEST 1 VIEW COMPARISON:  None. FINDINGS: There is patchy airspace opacity in the right mid lung and both base regions. Heart is upper normal in size with pulmonary vascularity normal. No adenopathy. No bone lesions. IMPRESSION: Multifocal pneumonia with patchy airspace opacity in both lung bases and right mid lung regions. Atypical pneumonia including viral pneumonia can present in this manner. No adenopathy evident. Electronically Signed   By: Bretta Bang III M.D.   On: 09/25/2018 20:01      Scheduled Meds: . aspirin EC  81 mg Oral Daily  . heparin  5,000 Units Subcutaneous Q8H  . sodium chloride flush  3 mL Intravenous Once  . sodium chloride flush  3 mL Intravenous Q12H   Continuous Infusions: . azithromycin    . cefTRIAXone (ROCEPHIN)  IV       LOS: 1 day      Calvert Cantor, MD [facility]  Pager: [email or web address] Password [facility] 09/26/2018, 2:27 PM

## 2018-09-27 LAB — CBC WITH DIFFERENTIAL/PLATELET
Abs Immature Granulocytes: 0.09 10*3/uL — ABNORMAL HIGH (ref 0.00–0.07)
BASOS ABS: 0 10*3/uL (ref 0.0–0.1)
Basophils Relative: 0 %
EOS PCT: 0 %
Eosinophils Absolute: 0 10*3/uL (ref 0.0–0.5)
HCT: 43.5 % (ref 39.0–52.0)
Hemoglobin: 14 g/dL (ref 13.0–17.0)
Immature Granulocytes: 1 %
Lymphocytes Relative: 11 %
Lymphs Abs: 1.4 10*3/uL (ref 0.7–4.0)
MCH: 25.3 pg — ABNORMAL LOW (ref 26.0–34.0)
MCHC: 32.2 g/dL (ref 30.0–36.0)
MCV: 78.5 fL — ABNORMAL LOW (ref 80.0–100.0)
Monocytes Absolute: 0.3 10*3/uL (ref 0.1–1.0)
Monocytes Relative: 2 %
NRBC: 0 % (ref 0.0–0.2)
Neutro Abs: 10.5 10*3/uL — ABNORMAL HIGH (ref 1.7–7.7)
Neutrophils Relative %: 86 %
Platelets: 249 10*3/uL (ref 150–400)
RBC: 5.54 MIL/uL (ref 4.22–5.81)
RDW: 13.9 % (ref 11.5–15.5)
WBC: 12.3 10*3/uL — ABNORMAL HIGH (ref 4.0–10.5)

## 2018-09-27 LAB — COMPREHENSIVE METABOLIC PANEL
ALT: 150 U/L — ABNORMAL HIGH (ref 0–44)
AST: 148 U/L — AB (ref 15–41)
Albumin: 2.5 g/dL — ABNORMAL LOW (ref 3.5–5.0)
Alkaline Phosphatase: 60 U/L (ref 38–126)
Anion gap: 9 (ref 5–15)
BUN: 16 mg/dL (ref 8–23)
CO2: 25 mmol/L (ref 22–32)
Calcium: 8 mg/dL — ABNORMAL LOW (ref 8.9–10.3)
Chloride: 110 mmol/L (ref 98–111)
Creatinine, Ser: 1.04 mg/dL (ref 0.61–1.24)
GFR calc Af Amer: >60 mL/min (ref >60)
GFR calc non Af Amer: >60 mL/min (ref >60)
Glucose, Bld: 137 mg/dL — ABNORMAL HIGH (ref 70–99)
Potassium: 3.9 mmol/L (ref 3.5–5.1)
SODIUM: 144 mmol/L (ref 135–145)
Total Bilirubin: 0.6 mg/dL (ref 0.3–1.2)
Total Protein: 7.4 g/dL (ref 6.5–8.1)

## 2018-09-27 MED ORDER — BIOTENE DRY MOUTH MT LIQD: 15.0000 mL | OROMUCOSAL | Status: DC | PRN

## 2018-09-27 MED ORDER — ENOXAPARIN SODIUM 40 MG/0.4ML ~~LOC~~ SOLN
40.0000 mg | SUBCUTANEOUS | Status: DC
  Administered 2018-09-27 – 2018-09-28 (×2): 40 mg via SUBCUTANEOUS
  Filled 2018-09-27 (×2): qty 0.4

## 2018-09-27 MED ORDER — ROSUVASTATIN CALCIUM 20 MG PO TABS
20.0000 mg | ORAL_TABLET | Freq: Every day | ORAL | Status: DC
  Administered 2018-09-27 – 2018-10-02 (×6): 20 mg via ORAL
  Filled 2018-09-27 (×6): qty 1

## 2018-09-27 NOTE — Progress Notes (Signed)
PT Cancellation Note  Patient Details Name: Steven Jimenez MRN: [id number] DOB: 1946-01-12   Cancelled Treatment:    Reason Eval/Treat Not Completed: Patient not medically ready Per health system leadership, therapy services are being held at this time until patient tests negative for COVID-19.    Etta Grandchild, PT, DPT Acute Rehabilitation Services Pager: [phone] Office: [phone]     Etta Grandchild 09/27/2018, 8:41 AM

## 2018-09-27 NOTE — Progress Notes (Addendum)
OT Cancellation Note  Patient Details Name: Steven Jimenez MRN: [id number] DOB: 1946/03/04   Cancelled Treatment:    Reason Eval/Treat Not Completed: Patient not medically ready. Pt's COVID-19 test pending. Per health system leadership, therapy services are being held at this time until patient tests negative for COVID-19.     Revonda Standard Cecil Cranker) Glendell Docker OTR/L Acute Rehabilitation Services Pager: [phone] Office: [phone]   Sandrea Hughs 09/27/2018, 9:09 AM

## 2018-09-27 NOTE — Progress Notes (Signed)
PROGRESS NOTE    Steven Jimenez  OZH:[id number] DOB: Oct 08, 1945 DOA: 09/25/2018 PCP: Patient, No Pcp Per      Brief Narrative:  Steven Jimenez is a 73 y.o. M with BPH who presented due to slurred speech which progressed to aphasia and generalized weakness so family sent him to the ER.  In the ER, he was a phasic, but also hypoxic.  Chest x-ray showed a multifocal pneumonia with patchy airspace opacity in both lungs.  Head CT showed no acute infarct.  He was started on ceftriaxone and azithromycin, SARS-CoV-2 testing was sent.       Assessment & Plan:  Acute respiratory failure with hypoxia Community-acquired pneumonia, multifocal, bilateral Coronavirus, rule out Blood cultures negative, RVP negative. -Continue azithromycin and ceftriaxone -Wean oxygen as able -Follow source SARS-CoV-2 testing    Suspected stroke CT head negative.  MRI, carotids, echo pending until CoV ruled out.   -Neurology consulted -Non-invasive angiography pending -Echocardiogram pending  -Lipids ordered: LDL 112, started crestor 20 -Aspirin ordered at admission --> continue aspirin 81 -Atrial fibrillation: Not present, monitoring tele -tPA not given because severity too mild -Dysphagia screen ordered in ER -PT eval ordered: pending due to CoV -Smoking cessation: not pertinent       MDM and disposition: The below labs and imaging reports were reviewed and summarized above.  Medication management as above.  The patient was admitted with aphasia, found to have pneumonia and suspected stroke.  MRI must be delayed due to CoV pending.       DVT prophylaxis: Lovenox Code Status: FULL Family Communication: Daughter by phone    Consultants:   Neurology  Procedures:   CT head  Antimicrobials:   Ceftriaxone 3/23 >>  Azithromycin 3/23 >>    Subjective: No complaints.  Aphasia persists.  No new weakness.  No more fever, cough.  No respiratory distress.  Objective: Vitals:   09/26/18 0024 09/26/18 0310 09/26/18 2337 09/27/18 0900  BP: 122/75  122/75 136/77  Pulse: 74 74 85 85  Resp:   18   Temp: 97.7 F (36.5 C) 97.7 F (36.5 C) 99.9 F (37.7 C) 98.4 F (36.9 C)  TempSrc: Oral Oral Oral Oral  SpO2: 97%  93% 90%  Weight:      Height:        Intake/Output Summary (Last 24 hours) at 09/27/2018 1301 Last data filed at 09/27/2018 0400 Gross per 24 hour  Intake 833 ml  Output 1500 ml  Net -667 ml   Filed Weights   09/25/18 2245  Weight: 72 kg    Examination: General appearance:  adult male, alert and in no acute distress.   atching TV HEENT: Anicteric, conjunctiva pink, lids and lashes normal. No nasal deformity, discharge, epistaxis.  Lips moist, dentition good, OP moist no oral lesions.   Skin: Warm and dry.   No suspicious rashes or lesions. Cardiac: RRR, nl S1-S2, no murmurs appreciated.  Capillary refill is brisk.  JVP normal.  No LE edema.  Radial pulses 2+ and symmetric. Respiratory: Normal respiratory rate and rhythm.  CTAB without rales or wheezes. Abdomen: Abdomen soft.  no TTP. No ascites, distension, hepatosplenomegaly.   MSK: No deformities or effusions. Neuro: Awake and alert. Aphasic, but intermittent fluent speech. EOMI, moves all extremitieswith equal strength. Psych: Attention seems normal. Affect blunted.  Judgment and insight unable to assess due to aphasia.    Data Reviewed: I have personally reviewed following labs and imaging studies:  CBC: Recent Labs  Lab 09/25/18 1804  09/26/18 0214 09/27/18 0317  WBC 9.2 9.5 12.3*  NEUTROABS 7.4 7.6 10.5*  HGB 15.5 14.5 14.0  HCT 47.9 43.5 43.5  MCV 78.0* 78.5* 78.5*  PLT 215 212 249   Basic Metabolic Panel: Recent Labs  Lab 09/25/18 1804 09/26/18 0214 09/27/18 0317  NA 140 139 144  K 4.2 4.1 3.9  CL 105 105 110  CO2 26 23 25   GLUCOSE 146* 125* 137*  BUN 19 22 16   CREATININE 1.06 1.11 1.04  CALCIUM 8.7* 8.2* 8.0*   GFR: Estimated Creatinine Clearance: 55 mL/min (by  C-G formula based on SCr of 1.04 mg/dL). Liver Function Tests: Recent Labs  Lab 09/25/18 1804 09/26/18 0214 09/27/18 0317  AST 104* 104* 148*  ALT 139* 130* 150*  ALKPHOS 60 55 60  BILITOT 0.6 0.8 0.6  PROT 8.2* 7.1 7.4  ALBUMIN 3.3* 3.0* 2.5*   No results for input(s): LIPASE, AMYLASE in the last 168 hours. No results for input(s): AMMONIA in the last 168 hours. Coagulation Profile: Recent Labs  Lab 09/25/18 1804  INR 1.0   Cardiac Enzymes: No results for input(s): CKTOTAL, CKMB, CKMBINDEX, TROPONINI in the last 168 hours. BNP (last 3 results) No results for input(s): PROBNP in the last 8760 hours. HbA1C: Recent Labs    09/25/18 2136  HGBA1C 7.3*   CBG: No results for input(s): GLUCAP in the last 168 hours. Lipid Profile: Recent Labs    09/26/18 0214  CHOL 175  HDL 29*  LDLCALC 112*  TRIG 172*  CHOLHDL 6.0   Thyroid Function Tests: No results for input(s): TSH, T4TOTAL, FREET4, T3FREE, THYROIDAB in the last 72 hours. Anemia Panel: No results for input(s): VITAMINB12, FOLATE, FERRITIN, TIBC, IRON, RETICCTPCT in the last 72 hours. Urine analysis:    Component Value Date/Time   COLORURINE YELLOW 03/08/2017 1545   APPEARANCEUR CLOUDY (A) 03/08/2017 1545   LABSPEC 1.025 03/08/2017 1545   PHURINE 6.0 03/08/2017 1545   GLUCOSEU NEGATIVE 03/08/2017 1545   HGBUR LARGE (A) 03/08/2017 1545   BILIRUBINUR NEGATIVE 03/08/2017 1545   KETONESUR NEGATIVE 03/08/2017 1545   PROTEINUR NEGATIVE 03/08/2017 1545   NITRITE POSITIVE (A) 03/08/2017 1545   LEUKOCYTESUR LARGE (A) 03/08/2017 1545   Sepsis Labs: @LABRCNTIP (procalcitonin:4,lacticacidven:4)  ) Recent Results (from the past 240 hour(s))  Blood culture (routine x 2)     Status: None (Preliminary result)   Collection Time: 09/25/18  8:14 PM  Result Value Ref Range Status   Specimen Description BLOOD RIGHT ANTECUBITAL  Final   Special Requests   Final    BOTTLES DRAWN AEROBIC AND ANAEROBIC Blood Culture adequate  volume   Culture   Final    NO GROWTH 2 DAYS Performed at [facility], [address] [place], [place] [address]    Report Status PENDING  Incomplete  Blood culture (routine x 2)     Status: None (Preliminary result)   Collection Time: 09/25/18  8:16 PM  Result Value Ref Range Status   Specimen Description BLOOD LEFT FOREARM  Final   Special Requests   Final    BOTTLES DRAWN AEROBIC ONLY Blood Culture results may not be optimal due to an inadequate volume of blood received in culture bottles   Culture   Final    NO GROWTH 2 DAYS Performed at [facility], [address], [place], [place] [address]    Report Status PENDING  Incomplete  Respiratory Panel by PCR     Status: None   Collection Time:  09/26/18  5:36 AM  Result Value Ref Range Status   Adenovirus NOT DETECTED NOT DETECTED Final   Coronavirus 229E NOT DETECTED NOT DETECTED Final    Comment: (NOTE) The Coronavirus on the Respiratory Panel, DOES NOT test for the novel  Coronavirus (2019 nCoV)    Coronavirus HKU1 NOT DETECTED NOT DETECTED Final   Coronavirus NL63 NOT DETECTED NOT DETECTED Final   Coronavirus OC43 NOT DETECTED NOT DETECTED Final   Metapneumovirus NOT DETECTED NOT DETECTED Final   Rhinovirus / Enterovirus NOT DETECTED NOT DETECTED Final   Influenza A NOT DETECTED NOT DETECTED Final   Influenza B NOT DETECTED NOT DETECTED Final   Parainfluenza Virus 1 NOT DETECTED NOT DETECTED Final   Parainfluenza Virus 2 NOT DETECTED NOT DETECTED Final   Parainfluenza Virus 3 NOT DETECTED NOT DETECTED Final   Parainfluenza Virus 4 NOT DETECTED NOT DETECTED Final   Respiratory Syncytial Virus NOT DETECTED NOT DETECTED Final   Bordetella pertussis NOT DETECTED NOT DETECTED Final   Chlamydophila pneumoniae NOT DETECTED NOT DETECTED Final   Mycoplasma pneumoniae NOT DETECTED NOT DETECTED Final    Comment: Performed at [facility], [address], [place], [place] [address]         Radiology  Studies: Ct Head Wo Contrast  Result Date: 09/25/2018 CLINICAL DATA:  Slurred speech and altered mental status EXAM: CT HEAD WITHOUT CONTRAST TECHNIQUE: Contiguous axial images were obtained from the base of the skull through the vertex without intravenous contrast. COMPARISON:  None. FINDINGS: Brain: There is mild age related volume loss. There is no intracranial mass, hemorrhage, extra-axial fluid collection, or midline shift. There is slight small vessel disease in the centra semiovale bilaterally. Brain parenchyma otherwise appears unremarkable. No acute infarct evident. Vascular: There is no hyperdense vessel. There is calcification in each carotid siphon region. Skull: The bony calvarium appears intact. Sinuses/Orbits: Paranasal sinuses are clear. Orbits appear symmetric bilaterally. Other: Mastoid air cells are clear. IMPRESSION: Mild age related volume loss with slight periventricular small vessel disease. No acute infarct. No mass or hemorrhage. There are foci of arterial vascular calcification. Electronically Signed   By: Bretta Bang III M.D.   On: 09/25/2018 18:53   Dg Chest Portable 1 View  Result Date: 09/25/2018 CLINICAL DATA:  Shortness of breath with cough and fever EXAM: PORTABLE CHEST 1 VIEW COMPARISON:  None. FINDINGS: There is patchy airspace opacity in the right mid lung and both base regions. Heart is upper normal in size with pulmonary vascularity normal. No adenopathy. No bone lesions. IMPRESSION: Multifocal pneumonia with patchy airspace opacity in both lung bases and right mid lung regions. Atypical pneumonia including viral pneumonia can present in this manner. No adenopathy evident. Electronically Signed   By: Bretta Bang III M.D.   On: 09/25/2018 20:01        Scheduled Meds: . aspirin EC  81 mg Oral Daily  . enoxaparin (LOVENOX) injection  40 mg Subcutaneous Q24H  . sodium chloride flush  3 mL Intravenous Once  . sodium chloride flush  3 mL Intravenous Q12H    Continuous Infusions: . azithromycin 500 mg (09/26/18 2336)  . cefTRIAXone (ROCEPHIN)  IV 1 g (09/26/18 2113)     LOS: 2 days    Time spent: 35 minutes    Alberteen Sam, MD [facility] 09/27/2018, 1:01 PM     Please page through AMION:  [email or web address] Password [email or web address] If 7PM-7AM, please contact night-coverage

## 2018-09-27 NOTE — Progress Notes (Addendum)
Hospitalist Interim Progress Note  Spoke with daughter Kelley Leaming by phone.  She expressed frustrations with current cares.  Specifically, she had received mixed messages regarding timing of MRI imaging, diet, and who was responsible for the ordering and canceling of tests.  She also had concerns about her father being isolated in the hospital, alone from family and not understanding what was happening.    I expressed my sincere regret about his isolation during this pandemic, and that the exigencies of this national emergency are at odds with our ideals of compassionate care.  We discussed the work up of suspected stroke, including that her father was not a candidate for tPA, that he was stable (NIHSS 0-1 in first 24 hours), that he had no new symptoms but was being monitored q4hrs by nursing and continuously on telemetry.  We discussed that appropriate secondary stroke work up was delayed pending test results (all non-emergent/non-essential testing systemwide, and at other health systems is cancelled at this time), but would be carried out as soon as was safe to do so.  We also discussed his current diet, which had been ordered since the night of admission.  The family provided me with the phone number for the [facility] transfer center, and misled me to believe, when I expressly asked, that they had already spoken with a representative from [facility] who was authorized to initiate a transfer.  When I called the transfer center, I found that no such contact had been made.  I then requested a transfer for family wishes, spoke with the accepting MD at [facility] at 3:15P on 09/27/18.  That MD declined transfer, given there is no service available at [facility] that is not also available at [facility], that she agreed with all aspects of current management as we had discussed them (in particular, delay of all MRI brain, echo, carotid imaging, etc until Coronavirus testing results were resolved).  I  called back to daughter, who stated she was on her way up to the hospital to pick up the patient and sign him out against medical advice.  She then hung up the phone.    Addendum: Spoke with patient.  He was unable to articulate his two medical problems (pneumonia with possible COVID and suspected stroke), even after I reviewed these with him.  The patient's interactions do not consistently demonstrate ordered, logical thinking and his decision-making capacity was questioned. In semi-structured interview, the patient: --was not able to articulate his medical problem and the proposed treatment plan.  --was able to articulate his pneumonia treatment plan and the option to refuse proposed options --however, he was not able to appreciate reasonably forseeable consequences of the proposed treatment plan nor of refusing the proposed treatment plan  He is not, in my opinion, impaired in decision making by depression, psychosis.   For the above reason, I do not believe he has medical decision-making capacity.   Spoke with two other daughters (patient also has son).  They were able to clearly articulate risks and benefits of remaining in hospital vs discharge against medical advice, and they stated preference to remain in hospital.

## 2018-09-28 LAB — COMPREHENSIVE METABOLIC PANEL
ALT: 131 U/L — ABNORMAL HIGH (ref 0–44)
AST: 98 U/L — ABNORMAL HIGH (ref 15–41)
Albumin: 2.4 g/dL — ABNORMAL LOW (ref 3.5–5.0)
Alkaline Phosphatase: 62 U/L (ref 38–126)
Anion gap: 12 (ref 5–15)
BUN: 18 mg/dL (ref 8–23)
CO2: 25 mmol/L (ref 22–32)
Calcium: 8.3 mg/dL — ABNORMAL LOW (ref 8.9–10.3)
Chloride: 112 mmol/L — ABNORMAL HIGH (ref 98–111)
Creatinine, Ser: 1.04 mg/dL (ref 0.61–1.24)
GFR calc Af Amer: >60 mL/min (ref >60)
GFR calc non Af Amer: >60 mL/min (ref >60)
Glucose, Bld: 141 mg/dL — ABNORMAL HIGH (ref 70–99)
Potassium: 3.9 mmol/L (ref 3.5–5.1)
SODIUM: 149 mmol/L — AB (ref 135–145)
Total Bilirubin: 0.6 mg/dL (ref 0.3–1.2)
Total Protein: 6.6 g/dL (ref 6.5–8.1)

## 2018-09-28 LAB — CBC WITH DIFFERENTIAL/PLATELET
Abs Immature Granulocytes: 0.16 10*3/uL — ABNORMAL HIGH (ref 0.00–0.07)
Basophils Absolute: 0 10*3/uL (ref 0.0–0.1)
Basophils Relative: 0 %
Eosinophils Absolute: 0 10*3/uL (ref 0.0–0.5)
Eosinophils Relative: 0 %
HCT: 44 % (ref 39.0–52.0)
Hemoglobin: 14.1 g/dL (ref 13.0–17.0)
Immature Granulocytes: 1 %
LYMPHS PCT: 11 %
Lymphs Abs: 1.4 10*3/uL (ref 0.7–4.0)
MCH: 25.4 pg — ABNORMAL LOW (ref 26.0–34.0)
MCHC: 32 g/dL (ref 30.0–36.0)
MCV: 79.1 fL — ABNORMAL LOW (ref 80.0–100.0)
Monocytes Absolute: 0.3 10*3/uL (ref 0.1–1.0)
Monocytes Relative: 3 %
Neutro Abs: 10.6 10*3/uL — ABNORMAL HIGH (ref 1.7–7.7)
Neutrophils Relative %: 85 %
Platelets: 315 10*3/uL (ref 150–400)
RBC: 5.56 MIL/uL (ref 4.22–5.81)
RDW: 14.1 % (ref 11.5–15.5)
WBC: 12.6 10*3/uL — ABNORMAL HIGH (ref 4.0–10.5)
nRBC: 0 % (ref 0.0–0.2)

## 2018-09-28 LAB — MAGNESIUM: Magnesium: 3.2 mg/dL — ABNORMAL HIGH (ref 1.7–2.4)

## 2018-09-28 LAB — C-REACTIVE PROTEIN: CRP: 15.9 mg/dL — ABNORMAL HIGH (ref <1.0)

## 2018-09-28 LAB — FERRITIN: Ferritin: 1579 ng/mL — ABNORMAL HIGH (ref 24–336)

## 2018-09-28 LAB — LACTATE DEHYDROGENASE: LDH: 398 U/L — ABNORMAL HIGH (ref 98–192)

## 2018-09-28 LAB — FIBRINOGEN: Fibrinogen: 765 mg/dL — ABNORMAL HIGH (ref 210–475)

## 2018-09-28 MED ORDER — BISACODYL 10 MG RE SUPP: 10.0000 mg | Freq: Every day | RECTAL | Status: DC | PRN

## 2018-09-28 MED ORDER — POLYETHYLENE GLYCOL 3350 17 G PO PACK: 17.0000 g | PACK | Freq: Two times a day (BID) | ORAL | Status: DC | PRN

## 2018-09-28 MED ORDER — SENNOSIDES-DOCUSATE SODIUM 8.6-50 MG PO TABS: 1.0000 | ORAL_TABLET | Freq: Two times a day (BID) | ORAL | Status: DC | PRN

## 2018-09-28 MED ORDER — DEXTROSE 5 % IV SOLN
INTRAVENOUS | Status: DC
  Administered 2018-09-28: 09:00:00 via INTRAVENOUS

## 2018-09-28 NOTE — Progress Notes (Signed)
Patient on covid isolation, agreed verbally for signature for Important message from Medicare.

## 2018-09-28 NOTE — Progress Notes (Signed)
SLP Cancellation Note  Patient Details Name: Steven Jimenez MRN: [id number] DOB: 1945-07-30   Cancelled treatment:       Reason Eval/Treat Not Completed: Medical issues which prohibited therapy. Per health system leadership, therapy services are being held at this time until patient tests negative for COVID-19.    [name], [place], CCC-SLP Speech-Language Pathologist Acute Rehabilitation Services Pager: [phone] Office: [phone]   [name] 09/28/2018, 8:15 AM

## 2018-09-28 NOTE — Progress Notes (Signed)
Inpatient Diabetes Program Recommendations  AACE/ADA: New Consensus Statement on Inpatient Glycemic Control (2015)  Target Ranges:  Prepandial:   less than 140 mg/dL      Peak postprandial:   less than 180 mg/dL (1-2 hours)      Critically ill patients:  140 - 180 mg/dL   Lab Results  Component Value Date   HGBA1C 7.3 (H) 09/25/2018   Review of Glycemic Control Results for RIGDON, RECCHIA (MRN [id number]) as of 09/28/2018 11:11  Ref. Range 09/25/2018 18:04 09/26/2018 02:14 09/27/2018 03:17 09/28/2018 02:53  Glucose Latest Ref Range: 70 - 99 mg/dL 480 (H) 165 (H) 537 (H) 141 (H)   Diabetes history: None Recommendations:   Note elevated A1C.  No history of DM noted. ? Is this new. Will follow.   Thanks,  Beryl Meager, RN, BC-ADM Inpatient Diabetes Coordinator Pager [phone] (8a-5p)

## 2018-09-28 NOTE — Progress Notes (Signed)
PT Cancellation Note  Patient Details Name: Steven Jimenez MRN: [id number] DOB: 14-Jun-1946   Cancelled Treatment:    Reason Eval/Treat Not Completed: Patient not medically ready Pending COVID 19 results. Per health system leadership, therapy services are being held at this time until patient tests negative for COVID-19.    Gladys Damme, PT, DPT  Acute Rehabilitation Services  Pager: [phone] Office: [phone]    Lehman Prom 09/28/2018, 9:25 AM

## 2018-09-28 NOTE — Progress Notes (Addendum)
PROGRESS NOTE    Steven Jimenez  YQM:[id number] DOB: 12-11-1945 DOA: 09/25/2018 PCP: Patient, No Pcp Per      Brief Narrative:  Steven Jimenez is a 73 y.o. M with no significant PMHx who presented due to fever, cough, and slurred speech which progressed to aphasia.  The patient was in [facility] until a few days before admission.  He was caring for wife who had pneumonia, then he himself developed cough, fever, body aches, malaise.  He may have had some slurred speech but subsequently developed (according to duaghter by phone) abrupt onset of inability to speak.  She took him to PCP who ordered flu swab (negative), CXR (showed pneumonia), SARS-CoV-2 NAAT (pending at time of admission) and recommended, due to his inability to speak in their office, that he be evaluated in the ER for possible stroke.  In the ER, he was aphasic, but also hypoxic.  Initial NIHSS 3.  Chest x-ray showed a multifocal pneumonia with patchy airspace opacity in both lungs.  Head CT showed no acute infarct.  He was started on ceftriaxone and azithromycin, SARS-CoV-2 testing was sent.  Per EDP note and admission H&P, his speech symptoms were present for 2 days at admission.  Per Steven Jimenez on phone, his aphasia was abrupt onset on day of admission, prior to PCP office visit.     Assessment & Plan:  Acute respiratory failure with hypoxia Community-acquired pneumonia, multifocal, bilateral Coronavirus, rule out Blood cultures negative, strep and legionella urine antigens negative.  RVP negative.  Ferritin >1000, CRP trending up, LDH no change 398, WBC no change.    O2 up to 3Lpm, O2 stable in upper 90s on this, no respiratory distress.  -Continue azithromycin and ceftriaxone, day 3 of 5 -Wean oxygen as able -Follow source SARS-CoV-2 testing    Suspected stroke CT head negative at admission.  NIHSS 3 on arrival, subsequently 0 to 1 for first 24 hours, currently 0.    MRI, carotids, echo pending until CoV ruled out.   Discussed with Neurology by phone, who were not asked to evaluate the patient due to possible COVID.  They note he was not a tPA candidate at admission, tPA still not indicated.  They recommend MRI brain, on a non-emergent basis, as it's outcome currently would not change management. -Non-invasive angiography pending -Echocardiogram pending  -Lipids ordered: LDL 112, continue Crestor -Aspirin ordered at admission --> continue aspirin -Diabetes: new onset -Atrial fibrillation: Not present, monitoring tele -tPA not given because severity too mild -Dysphagia screen ordered in ER -PT eval ordered: pending due to CoV -Smoking cessation: not pertinent  Brief VT K 3.9 -Check mag  Type 2 diabetes New diagnosis -Accuchecks twice daily         MDM and disposition: The below labs and imaging reports were reviewed and summarized above.  Medication management as above.  The patient was admitted with aphasia, but found to have pneumonia and suspected COVID.  Secondary stroke work-up is currently delayed as we await coronavirus test results.  His O2 requirement has worsened in the last 24 hours that he now needs 3 L supplemental oxygen .       DVT prophylaxis: Lovenox Code Status: FULL Family Communication: Daughters by phone    Consultants:   Neurology  Procedures:   CT head  Antimicrobials:   Ceftriaxone 3/23 >>  Azithromycin 3/23 >>    Subjective: No new complaints.  No new weakness, numbness, seizure.  No fever, cough, respiratory distress.  Breathing feels comfortable.  His aphasia appears to be improving.     Objective: Vitals:   09/27/18 0900 09/27/18 1631 09/28/18 0557 09/28/18 0750  BP: 136/77 137/71 (!) 146/83 (!) 150/80  Pulse: 85 87 89 87  Resp:  16  17  Temp: 98.4 F (36.9 C) 99.2 F (37.3 C) 99.1 F (37.3 C) 98.8 F (37.1 C)  TempSrc: Oral Oral Oral Oral  SpO2: 90% 90% (!) 89% (!) 87%  Weight:      Height:        Intake/Output Summary  (Last 24 hours) at 09/28/2018 1043 Last data filed at 09/28/2018 0454 Gross per 24 hour  Intake 240 ml  Output 1075 ml  Net -835 ml   Filed Weights   09/25/18 2245  Weight: 72 kg    Examination: General appearance: Adult male, lying in bed, watching television, no acute distress. HEENT: Anicteric, conjunctival pink, lids and lashes normal.  No nasal deformity, discharge, or epistaxis.  Lips moist, dentition good, oropharynx moist, no oral lesions.   Skin: Warm and dry, no suspicious rashes or lesions. Cardiac: Regular rate and rhythm, no murmurs appreciated, JVP normal, no lower extremity edema Respiratory: Normal respiratory rate and rhythm, lung sounds diminished, but no rales or wheezes appreciated. Abdomen: Soft without tenderness to palpation, rigidity, or guarding.   MSK: No deformities or effusions. Neuro: Extraocular movements are intact, without nystagmus.  Cranial nerve 5 is within normal limits.  Cranial nerve 7 is symmetrical.  Cranial nerve 8 is within normal limits.  Cranial nerves 9 and 10 reveal equal palate elevation.  Cranial nerve 11 reveals sternocleidomastoid strong.  Cranial nerve 12 is midline.  Motor strength testing is 5/5 in the upper and lower extremities bilaterally with normal motor, tone and bulk. Sensory examination is intact to light touch.  The patient is oriented to time, place and person.  Speech is fluent.  Naming is grossly intact.  He has marked psychomotor slowing.  However his recall, recent and remote, as well as general fund of knowledge seem within normal limits.   Psych: Attention seems normal, affect blunted, judgment and insight seem moderately impaired.  Question dementia.    Data Reviewed: I have personally reviewed following labs and imaging studies:  CBC: Recent Labs  Lab 09/25/18 1804 09/26/18 0214 09/27/18 0317 09/28/18 0253  WBC 9.2 9.5 12.3* 12.6*  NEUTROABS 7.4 7.6 10.5* 10.6*  HGB 15.5 14.5 14.0 14.1  HCT 47.9 43.5 43.5 44.0   MCV 78.0* 78.5* 78.5* 79.1*  PLT 215 212 249 315   Basic Metabolic Panel: Recent Labs  Lab 09/25/18 1804 09/26/18 0214 09/27/18 0317 09/28/18 0253  NA 140 139 144 149*  K 4.2 4.1 3.9 3.9  CL 105 105 110 112*  CO2 GLUCOSE 146* 125* 137* 141*  BUN CREATININE 1.06 1.11 1.04 1.04  CALCIUM 8.7* 8.2* 8.0* 8.3*   GFR: Estimated Creatinine Clearance: 55 mL/min (by C-G formula based on SCr of 1.04 mg/dL). Liver Function Tests: Recent Labs  Lab 09/25/18 1804 09/26/18 0214 09/27/18 0317 09/28/18 0253  AST 104* 104* 148* 98*  ALT 139* 130* 150* 131*  ALKPHOS 60 55 60 62  BILITOT 0.6 0.8 0.6 0.6  PROT 8.2* 7.1 7.4 6.6  ALBUMIN 3.3* 3.0* 2.5* 2.4*   No results for input(s): LIPASE, AMYLASE in the last 168 hours. No results for input(s): AMMONIA in the last 168 hours. Coagulation Profile: Recent Labs  Lab 09/25/18 1804  INR 1.0  Cardiac Enzymes: No results for input(s): CKTOTAL, CKMB, CKMBINDEX, TROPONINI in the last 168 hours. BNP (last 3 results) No results for input(s): PROBNP in the last 8760 hours. HbA1C: Recent Labs    09/25/18 2136  HGBA1C 7.3*   CBG: No results for input(s): GLUCAP in the last 168 hours. Lipid Profile: Recent Labs    09/26/18 0214  CHOL 175  HDL 29*  LDLCALC 112*  TRIG 172*  CHOLHDL 6.0   Thyroid Function Tests: No results for input(s): TSH, T4TOTAL, FREET4, T3FREE, THYROIDAB in the last 72 hours. Anemia Panel: Recent Labs    09/28/18 0253  FERRITIN 1,579*   Urine analysis:    Component Value Date/Time   COLORURINE YELLOW 03/08/2017 1545   APPEARANCEUR CLOUDY (A) 03/08/2017 1545   LABSPEC 1.025 03/08/2017 1545   PHURINE 6.0 03/08/2017 1545   GLUCOSEU NEGATIVE 03/08/2017 1545   HGBUR LARGE (A) 03/08/2017 1545   BILIRUBINUR NEGATIVE 03/08/2017 1545   KETONESUR NEGATIVE 03/08/2017 1545   PROTEINUR NEGATIVE 03/08/2017 1545   NITRITE POSITIVE (A) 03/08/2017 1545   LEUKOCYTESUR LARGE (A) 03/08/2017  1545   Sepsis Labs: @LABRCNTIP (procalcitonin:4,lacticacidven:4)  ) Recent Results (from the past 240 hour(s))  Blood culture (routine x 2)     Status: None (Preliminary result)   Collection Time: 09/25/18  8:14 PM  Result Value Ref Range Status   Specimen Description BLOOD RIGHT ANTECUBITAL  Final   Special Requests   Final    BOTTLES DRAWN AEROBIC AND ANAEROBIC Blood Culture adequate volume   Culture   Final    NO GROWTH 2 DAYS Performed at [facility], [address], [place], [place] [address]    Report Status PENDING  Incomplete  Blood culture (routine x 2)     Status: None (Preliminary result)   Collection Time: 09/25/18  8:16 PM  Result Value Ref Range Status   Specimen Description BLOOD LEFT FOREARM  Final   Special Requests   Final    BOTTLES DRAWN AEROBIC ONLY Blood Culture results may not be optimal due to an inadequate volume of blood received in culture bottles   Culture   Final    NO GROWTH 2 DAYS Performed at [facility], [address] [place], [place] [address]    Report Status PENDING  Incomplete  Respiratory Panel by PCR     Status: None   Collection Time: 09/26/18  5:36 AM  Result Value Ref Range Status   Adenovirus NOT DETECTED NOT DETECTED Final   Coronavirus 229E NOT DETECTED NOT DETECTED Final    Comment: (NOTE) The Coronavirus on the Respiratory Panel, DOES NOT test for the novel  Coronavirus (2019 nCoV)    Coronavirus HKU1 NOT DETECTED NOT DETECTED Final   Coronavirus NL63 NOT DETECTED NOT DETECTED Final   Coronavirus OC43 NOT DETECTED NOT DETECTED Final   Metapneumovirus NOT DETECTED NOT DETECTED Final   Rhinovirus / Enterovirus NOT DETECTED NOT DETECTED Final   Influenza A NOT DETECTED NOT DETECTED Final   Influenza B NOT DETECTED NOT DETECTED Final   Parainfluenza Virus 1 NOT DETECTED NOT DETECTED Final   Parainfluenza Virus 2 NOT DETECTED NOT DETECTED Final   Parainfluenza Virus 3 NOT DETECTED NOT DETECTED Final    Parainfluenza Virus 4 NOT DETECTED NOT DETECTED Final   Respiratory Syncytial Virus NOT DETECTED NOT DETECTED Final   Bordetella pertussis NOT DETECTED NOT DETECTED Final   Chlamydophila pneumoniae NOT DETECTED NOT DETECTED Final   Mycoplasma pneumoniae NOT DETECTED NOT DETECTED  Final    Comment: Performed at [facility], [address], [place], [place] [address]         Radiology Studies: No results found.      Scheduled Meds: . aspirin EC  81 mg Oral Daily  . enoxaparin (LOVENOX) injection  40 mg Subcutaneous Q24H  . rosuvastatin  20 mg Oral q1800  . sodium chloride flush  3 mL Intravenous Once  . sodium chloride flush  3 mL Intravenous Q12H   Continuous Infusions: . azithromycin 500 mg (09/27/18 2100)  . cefTRIAXone (ROCEPHIN)  IV 1 g (09/27/18 2113)  . dextrose 75 mL/hr at 09/28/18 0835     LOS: 3 days    Time spent: 25 minutes    Alberteen Sam, MD [facility] 09/28/2018, 10:43 AM     Please page through AMION:  [email or web address] Password [email or web address] If 7PM-7AM, please contact night-coverage   Date: 09/28/18  12:18 PM Patient Isolation: Droplet + Contact HCP PPE: Facemask, goggles, gown, gloves Patient PPE: none

## 2018-09-28 NOTE — Plan of Care (Signed)
  Problem: Health Behavior/Discharge Planning: Goal: Ability to manage health-related needs will improve Outcome: Not Progressing   Problem: Clinical Measurements: Goal: Respiratory complications will improve Outcome: Progressing

## 2018-09-28 NOTE — Care Management Important Message (Addendum)
Important Message  Patient Details  Name: Steven Jimenez MRN: [id number] Date of Birth: May 23, 1946   Medicare Important Message Given:   yes  I Letha Cape RN spoke with patient ,explained form to patient.  Patient is agreeable to verbal signature.  I have instructed RN assigned to patient to deliver to patient room.     Leone Haven, RN 09/28/2018, 9:35 AM

## 2018-09-29 ENCOUNTER — Inpatient Hospital Stay (HOSPITAL_COMMUNITY): Payer: Medicare Other

## 2018-09-29 MED ORDER — MAGNESIUM OXIDE 400 (241.3 MG) MG PO TABS
400.0000 mg | ORAL_TABLET | Freq: Two times a day (BID) | ORAL | Status: AC
  Administered 2018-09-29 – 2018-09-30 (×3): 400 mg via ORAL
  Filled 2018-09-29 (×3): qty 1

## 2018-09-29 MED ORDER — POTASSIUM CHLORIDE CRYS ER 20 MEQ PO TBCR
40.0000 meq | EXTENDED_RELEASE_TABLET | Freq: Two times a day (BID) | ORAL | Status: AC
  Administered 2018-09-29 (×2): 40 meq via ORAL
  Filled 2018-09-29 (×2): qty 2

## 2018-09-29 MED ORDER — FUROSEMIDE 10 MG/ML IJ SOLN: 40.0000 mg | Freq: Once | INTRAMUSCULAR | Status: DC

## 2018-09-29 MED ORDER — FUROSEMIDE 40 MG PO TABS
40.0000 mg | ORAL_TABLET | Freq: Two times a day (BID) | ORAL | Status: DC
  Administered 2018-09-29 – 2018-10-01 (×5): 40 mg via ORAL
  Filled 2018-09-29 (×5): qty 1

## 2018-09-29 MED ORDER — FUROSEMIDE 40 MG PO TABS
40.0000 mg | ORAL_TABLET | Freq: Once | ORAL | Status: AC
  Administered 2018-09-29: 40 mg via ORAL
  Filled 2018-09-29: qty 1

## 2018-09-29 MED ORDER — AZITHROMYCIN 250 MG PO TABS
500.0000 mg | ORAL_TABLET | Freq: Every day | ORAL | Status: AC
  Administered 2018-09-29 – 2018-10-01 (×3): 500 mg via ORAL
  Filled 2018-09-29 (×3): qty 2

## 2018-09-29 NOTE — Plan of Care (Signed)
  Problem: Health Behavior/Discharge Planning: Goal: Ability to manage health-related needs will improve Outcome: Adequate for Discharge   Problem: Clinical Measurements: Goal: Respiratory complications will improve Outcome: Progressing   Problem: Clinical Measurements: Goal: Cardiovascular complication will be avoided Outcome: Progressing

## 2018-09-29 NOTE — Progress Notes (Signed)
Patient being non-compliant and refusing IV, IV medication, am labs, telemetry. MD notified.

## 2018-09-29 NOTE — Progress Notes (Signed)
PROGRESS NOTE    Steven Jimenez  ZOX:[id number] DOB: December 09, 1945 DOA: 09/25/2018 PCP: Patient, No Pcp Per      Brief Narrative:  Mr. Poythress is a 73 y.o. M with no significant PMHx who presented due to fever, cough, and slurred speech which progressed to aphasia.  The patient was in [facility] until a few days before admission.  He was caring for wife who had pneumonia, then he himself developed cough, fever, body aches, malaise.  He may have had some slurred speech but subsequently developed (according to duaghter by phone) abrupt onset of inability to speak.  She took him to PCP who ordered flu swab (negative), CXR (showed pneumonia), SARS-CoV-2 NAAT (pending at time of admission) and recommended, due to his inability to speak in their office, that he be evaluated in the ER for possible stroke.  In the ER, he was aphasic, but also hypoxic.  Initial NIHSS 3.  Chest x-ray showed a multifocal pneumonia with patchy airspace opacity in both lungs.  Head CT showed no acute infarct.  He was started on ceftriaxone and azithromycin, SARS-CoV-2 testing was sent.  Per EDP note and admission H&P, his speech symptoms were present for 2 days at admission.  Per daughter on phone, his aphasia was abrupt onset on day of admission, prior to PCP office visit.     Assessment & Plan:  Acute respiratory failure with hypoxia Community-acquired pneumonia, multifocal, bilateral Coronavirus, rule out Blood cultures negative, strep and legionella urine antigens negative.  RVP negative.  Ferritin >1000, CRP trending up, LDH no change 398, WBC no change.    No change in O2, still requires 3Lpm, O2 stable lower 90s, desats to 80s on room air immediately.  No respiratory distress. CXR personally reviewed today, worsening bilatearl opacities -Continue azithromycin and ceftriaxone, day 4 --> will continue to 7d given worsening CXR -Stop IV fluids -Lasix 40 IV now -Check I/Os -Wean oxygen as able -Follow source SARS-CoV-2  testing    Suspected stroke CT head negative at admission.  NIHSS 3 on arrival, subsequently 0 to 1 for first 24 hours, currently 0.    MRI, carotids, echo pending until CoV ruled out.  Discussed with Neurology by phone, who were not asked to evaluate the patient due to possible COVID.  They note he was not a tPA candidate at admission, tPA still not indicated.  They recommend MRI brain, on a non-emergent basis, as it's outcome currently would not change management. -Non-invasive angiography pending -Echocardiogram pending  -Lipids ordered: LDL 112, continue Crestor -Aspirin ordered at admission --> continue aspirin -Diabetes: new onset -Atrial fibrillation: Not present, monitoring tele -tPA not given because severity too mild -Dysphagia screen ordered in ER -PT eval ordered: pending due to CoV -Smoking cessation: not pertinent  Acute metabolic encephalopathy His neurological exam is nonfocal, and his psychiatric exam is notable for decreased attention and psychomotor slowing.  I believe he has an encephalopathy from his pneumonia, and that this is what was initially perceived by family and PCP as "aphasia" but really represents a global process.  He remains without capacity for decision making.  Today in our discussion, after carefully reviewing, using different phrases and plain language, that leaving the hospital with worsening hypoxia and lung failure was potentially dangerous or deadly, the patient perseverated simply on "I wanna go home" and was unable to identify his oxygen need as a basic barrier to safe discharge.  He does not have decision making capacity.  His daughter Erie Noe I spoke with  again by phone today, who remains in agreement with continued inpatient care of pneumonia, possible stroke, acute hypoxic respiratory failure and possible fluid overload. The patient's wife is his current surrogate decision-maker under Veteran law, is of sound mind (per daughter Erie Noe).     NSVT Another longer run of asymptomatic, hemodynamically stable VT today.  K 3.9 -Maintain tele -Replete Mag -Replete K with Furosemide -Obtain echo after CoV isolation removed  Type 2 diabetes New diagnosis, HgbA1c 7.3%         MDM and disposition: The below labs and imaging reports were reviewed and summarized above.  Medication management as above.  The patient was admitted with change in mentation, suspected aphasia, but found to have pneumonia and possible COPD.  His secondary stroke work-up is currently related to a coronavirus testing results.  His oxygen requirements for acute hypoxic respiratory failure have not changed, he continues to require 2 L of supplemental oxygen, chest x-ray this morning, continued IV antibiotics and add Lasix.          DVT prophylaxis: Lovenox Code Status: FULL Family Communication: Daughters by phone    Consultants:   Neurology  Procedures:   CT head  Antimicrobials:   Ceftriaxone 3/23 >>  Azithromycin 3/23 >>    Subjective: No new complaints.  He denies abdominal pain.  He had a bowel movement this morning.  No fever, respiratory distress, chest discomfort.  No leg swelling.  No other new complaints.           Objective: Vitals:   09/28/18 1558 09/28/18 1900 09/28/18 2326 09/29/18 0831  BP: (!) 155/90 (!) 152/90 (!) 148/87   Pulse: 83 81 84   Resp: 15     Temp: 98.6 F (37 C) 98.3 F (36.8 C) 98.8 F (37.1 C)   TempSrc: Oral Oral Oral   SpO2: 92% 91% 91% 94%  Weight:      Height:        Intake/Output Summary (Last 24 hours) at 09/29/2018 1448 Last data filed at 09/29/2018 1400 Gross per 24 hour  Intake 3185 ml  Output 750 ml  Net 2435 ml   Filed Weights   09/25/18 2245  Weight: 72 kg    Examination: General appearance: Adult male, standing at the sink brushing his teeth, interactive HEENT: Anicteric, conjunctival pink, lids and lashes normal.  No nasal deformity, discharge, or epistaxis.  Lips  moist, dentition good, oropharynx moist, no oral lesions. Skin: Warm and dry, no suspicious rashes or lesions. Cardiac: Heart rate regular, no murmurs, JVP not visible, trace lower extremity edema Respiratory: The patient does not comply with lung exam, at rest his respiratory rate appears normal, I appreciate no rales or wheezes. Abdomen: Abdomen soft without tenderness or guarding.   MSK: Normal muscle bulk and tone Neuro: Extraocular movements intact, cranial nerves symmetric, strength and gait appear normal and symmetric.  Speech is fluent. Psych: Worker psychomotor slowing.  Attention is questionable.  Affect flat.  Judgment and insight appear impaired.       Data Reviewed: I have personally reviewed following labs and imaging studies:  CBC: Recent Labs  Lab 09/25/18 1804 09/26/18 0214 09/27/18 0317 09/28/18 0253  WBC 9.2 9.5 12.3* 12.6*  NEUTROABS 7.4 7.6 10.5* 10.6*  HGB 15.5 14.5 14.0 14.1  HCT 47.9 43.5 43.5 44.0  MCV 78.0* 78.5* 78.5* 79.1*  PLT 215 212 249 315   Basic Metabolic Panel: Recent Labs  Lab 09/25/18 1804 09/26/18 0214 09/27/18 0317 09/28/18 0253  NA  140 139 144 149*  K 4.2 4.1 3.9 3.9  CL 105 105 110 112*  CO2 26 23 25 25   GLUCOSE 146* 125* 137* 141*  BUN 19 22 16 18   CREATININE 1.06 1.11 1.04 1.04  CALCIUM 8.7* 8.2* 8.0* 8.3*  MG  --   --   --  3.2*   GFR: Estimated Creatinine Clearance: 55 mL/min (by C-G formula based on SCr of 1.04 mg/dL). Liver Function Tests: Recent Labs  Lab 09/25/18 1804 09/26/18 0214 09/27/18 0317 09/28/18 0253  AST 104* 104* 148* 98*  ALT 139* 130* 150* 131*  ALKPHOS 60 55 60 62  BILITOT 0.6 0.8 0.6 0.6  PROT 8.2* 7.1 7.4 6.6  ALBUMIN 3.3* 3.0* 2.5* 2.4*   No results for input(s): LIPASE, AMYLASE in the last 168 hours. No results for input(s): AMMONIA in the last 168 hours. Coagulation Profile: Recent Labs  Lab 09/25/18 1804  INR 1.0   Cardiac Enzymes: No results for input(s): CKTOTAL, CKMB,  CKMBINDEX, TROPONINI in the last 168 hours. BNP (last 3 results) No results for input(s): PROBNP in the last 8760 hours. HbA1C: No results for input(s): HGBA1C in the last 72 hours. CBG: No results for input(s): GLUCAP in the last 168 hours. Lipid Profile: No results for input(s): CHOL, HDL, LDLCALC, TRIG, CHOLHDL, LDLDIRECT in the last 72 hours. Thyroid Function Tests: No results for input(s): TSH, T4TOTAL, FREET4, T3FREE, THYROIDAB in the last 72 hours. Anemia Panel: Recent Labs    09/28/18 0253  FERRITIN 1,579*   Urine analysis:    Component Value Date/Time   COLORURINE YELLOW 03/08/2017 1545   APPEARANCEUR CLOUDY (A) 03/08/2017 1545   LABSPEC 1.025 03/08/2017 1545   PHURINE 6.0 03/08/2017 1545   GLUCOSEU NEGATIVE 03/08/2017 1545   HGBUR LARGE (A) 03/08/2017 1545   BILIRUBINUR NEGATIVE 03/08/2017 1545   KETONESUR NEGATIVE 03/08/2017 1545   PROTEINUR NEGATIVE 03/08/2017 1545   NITRITE POSITIVE (A) 03/08/2017 1545   LEUKOCYTESUR LARGE (A) 03/08/2017 1545   Sepsis Labs: @LABRCNTIP (procalcitonin:4,lacticacidven:4)  ) Recent Results (from the past 240 hour(s))  Blood culture (routine x 2)     Status: None (Preliminary result)   Collection Time: 09/25/18  8:14 PM  Result Value Ref Range Status   Specimen Description BLOOD RIGHT ANTECUBITAL  Final   Special Requests   Final    BOTTLES DRAWN AEROBIC AND ANAEROBIC Blood Culture adequate volume   Culture   Final    NO GROWTH 4 DAYS Performed at [facility], [address], [place], [place] [address]    Report Status PENDING  Incomplete  Blood culture (routine x 2)     Status: None (Preliminary result)   Collection Time: 09/25/18  8:16 PM  Result Value Ref Range Status   Specimen Description BLOOD LEFT FOREARM  Final   Special Requests   Final    BOTTLES DRAWN AEROBIC ONLY Blood Culture results may not be optimal due to an inadequate volume of blood received in culture bottles   Culture   Final    NO GROWTH 4  DAYS Performed at [facility], [address], [place], [place] [address]    Report Status PENDING  Incomplete  Respiratory Panel by PCR     Status: None   Collection Time: 09/26/18  5:36 AM  Result Value Ref Range Status   Adenovirus NOT DETECTED NOT DETECTED Final   Coronavirus 229E NOT DETECTED NOT DETECTED Final    Comment: (NOTE) The Coronavirus on the Respiratory  Panel, DOES NOT test for the novel  Coronavirus (2019 nCoV)    Coronavirus HKU1 NOT DETECTED NOT DETECTED Final   Coronavirus NL63 NOT DETECTED NOT DETECTED Final   Coronavirus OC43 NOT DETECTED NOT DETECTED Final   Metapneumovirus NOT DETECTED NOT DETECTED Final   Rhinovirus / Enterovirus NOT DETECTED NOT DETECTED Final   Influenza A NOT DETECTED NOT DETECTED Final   Influenza B NOT DETECTED NOT DETECTED Final   Parainfluenza Virus 1 NOT DETECTED NOT DETECTED Final   Parainfluenza Virus 2 NOT DETECTED NOT DETECTED Final   Parainfluenza Virus 3 NOT DETECTED NOT DETECTED Final   Parainfluenza Virus 4 NOT DETECTED NOT DETECTED Final   Respiratory Syncytial Virus NOT DETECTED NOT DETECTED Final   Bordetella pertussis NOT DETECTED NOT DETECTED Final   Chlamydophila pneumoniae NOT DETECTED NOT DETECTED Final   Mycoplasma pneumoniae NOT DETECTED NOT DETECTED Final    Comment: Performed at [facility], [address] [place], [place] [address]         Radiology Studies: Dg Abd 1 View  Result Date: 09/29/2018 CLINICAL DATA:  Pneumonia.  Abdominal distention. EXAM: ABDOMEN - 1 VIEW COMPARISON:  None. FINDINGS: The bowel gas pattern is normal. No radio-opaque calculi or other significant radiographic abnormality are seen. IMPRESSION: Negative. Electronically Signed   By: Myles RosenthalJohn  Stahl M.D.   On: 09/29/2018 08:20   Dg Chest Port 1 View  Result Date: 09/29/2018 CLINICAL DATA:  Pneumonia, follow-up EXAM: PORTABLE CHEST 1 VIEW COMPARISON:  Portable exam 0750 hours compared to a 09/25/2018 FINDINGS: Upper normal  heart size. Mediastinal contour stable. Increased airspace infiltrates in RIGHT lung and in LEFT lower lobe. No pleural effusion or pneumothorax. Endplate spur formation thoracic spine. No acute bony findings. IMPRESSION: Increased airspace infiltrates, asymmetrically greater on RIGHT. Electronically Signed   By: Ulyses SouthwardMark  Boles M.D.   On: 09/29/2018 08:22        Scheduled Meds:  aspirin EC  81 mg Oral Daily   enoxaparin (LOVENOX) injection  40 mg Subcutaneous Q24H   potassium chloride  40 mEq Oral BID   rosuvastatin  20 mg Oral q1800   sodium chloride flush  3 mL Intravenous Once   sodium chloride flush  3 mL Intravenous Q12H   Continuous Infusions:  azithromycin 500 mg (09/28/18 2058)   cefTRIAXone (ROCEPHIN)  IV 1 g (09/28/18 2007)     LOS: 4 days    Time spent: 45 minutes    Alberteen Samhristopher P Deyvi Bonanno, MD [facility] 09/29/2018, 2:48 PM     Please page through AMION:  [email or web address] Password [email or web address] If 7PM-7AM, please contact night-coverage   Date: 09/29/18  2:48 PM Patient Isolation: Droplet + Contact HCP PPE: Facemask, goggles, gown, gloves Patient PPE: none

## 2018-09-29 NOTE — Progress Notes (Signed)
OT Cancellation Note  Patient Details Name: Quason Lauritsen MRN: [id number] DOB: 06/20/1946   Cancelled Treatment:    Reason Eval/Treat Not Completed: Other (comment)(COVID-19 test pending.) Per health system leadership, therapy services are being held at this time until patient tests negative for COVID-19.    Revonda Standard Cecil Cranker) Glendell Docker OTR/L Acute Rehabilitation Services Pager: [phone] Office: [phone]   Sandrea Hughs 09/29/2018, 7:58 AM

## 2018-09-29 NOTE — Progress Notes (Signed)
Per Dr. Maryfrances Bunnell: create a restful environment, do not wake patient at night, restart IV in the morning and draw blood at same time, do not do neuro checks while sleeping.

## 2018-09-30 LAB — BRAIN NATRIURETIC PEPTIDE: B Natriuretic Peptide: 26.6 pg/mL (ref 0.0–100.0)

## 2018-09-30 LAB — CBC
HCT: 47.1 % (ref 39.0–52.0)
Hemoglobin: 14.8 g/dL (ref 13.0–17.0)
MCH: 25 pg — ABNORMAL LOW (ref 26.0–34.0)
MCHC: 31.4 g/dL (ref 30.0–36.0)
MCV: 79.6 fL — ABNORMAL LOW (ref 80.0–100.0)
PLATELETS: 370 10*3/uL (ref 150–400)
RBC: 5.92 MIL/uL — ABNORMAL HIGH (ref 4.22–5.81)
RDW: 13.9 % (ref 11.5–15.5)
WBC: 8.8 10*3/uL (ref 4.0–10.5)
nRBC: 0 % (ref 0.0–0.2)

## 2018-09-30 LAB — BASIC METABOLIC PANEL
Anion gap: 12 (ref 5–15)
BUN: 19 mg/dL (ref 8–23)
CALCIUM: 8.8 mg/dL — AB (ref 8.9–10.3)
CO2: 25 mmol/L (ref 22–32)
Chloride: 111 mmol/L (ref 98–111)
Creatinine, Ser: 1.03 mg/dL (ref 0.61–1.24)
GFR calc Af Amer: >60 mL/min (ref >60)
GFR calc non Af Amer: >60 mL/min (ref >60)
Glucose, Bld: 124 mg/dL — ABNORMAL HIGH (ref 70–99)
Potassium: 4.1 mmol/L (ref 3.5–5.1)
SODIUM: 148 mmol/L — AB (ref 135–145)

## 2018-09-30 LAB — CULTURE, BLOOD (ROUTINE X 2)
CULTURE: NO GROWTH
Culture: NO GROWTH
Special Requests: ADEQUATE

## 2018-09-30 NOTE — Progress Notes (Signed)
SLP Cancellation Note  Patient Details Name: Steven Jimenez MRN: [id number] DOB: 30-Jun-1946   Cancelled treatment:       Reason Eval/Treat Not Completed: Medical issues which prohibited therapy. Per health system leadership, therapy services are being held at this time until patient tests negative for COVID-19.     [name], [place], CCC-SLP Speech-Language Pathologist Acute Rehabilitation Services Pager: [phone] Office: [phone]  [name] 09/30/2018, 8:48 AM

## 2018-09-30 NOTE — Progress Notes (Signed)
PT Cancellation Note  Patient Details Name: Steven Jimenez MRN: [id number] DOB: [birth year]   Cancelled Treatment:      Cancelled treatment:       Reason Eval/Treat Not Completed: Medical issues which prohibited therapy. Per health system leadership, therapy services are being held at this time until patient tests negative for COVID-19.     Dessie Coma PT DPT  09/30/2018, 12:38 PM

## 2018-09-30 NOTE — Progress Notes (Signed)
PROGRESS NOTE    Steven Jimenez  HTM:[id number] DOB: 1946/04/19 DOA: 09/25/2018 PCP: Patient, No Pcp Per      Brief Narrative:  Steven Jimenez is a 73 y.o. M with no significant PMHx who presented due to fever, cough, and slurred speech which progressed to aphasia.  The patient was in USOH until a few days before admission.  He was caring for wife who had pneumonia, then he himself developed cough, fever, body aches, malaise.  He may have had some slurred speech but subsequently developed (according to duaghter by phone) abrupt onset of inability to speak.  She took him to PCP who ordered flu swab (negative), CXR (showed pneumonia), SARS-CoV-2 NAAT (pending at time of admission) and recommended, due to his inability to speak in their office, that he be evaluated in the ER for possible stroke.  In the ER, he was aphasic, but also hypoxic.  Initial NIHSS 3.  Chest x-ray showed a multifocal pneumonia with patchy airspace opacity in both lungs.  Head CT showed no acute infarct.  He was started on ceftriaxone and azithromycin, SARS-CoV-2 testing was sent.  Per EDP note and admission H&P, his speech symptoms were present for 2 days at admission.  Per daughter on phone, his aphasia was abrupt onset on day of admission, prior to PCP office visit.     Assessment & Plan:  Acute respiratory failure with hypoxia Community-acquired pneumonia, multifocal, bilateral Coronavirus, rule out Blood cultures negative, strep and legionella urine antigens negative.  RVP negative.     Weaning O2 today to 1L, respiratory status stable. -Continue azithromycin and ceftriaxone, day 5 --> will continue to 7d given worsening CXR -Incentive spirometry -Hold IV fluids -Wean oxygen as able -Follow source SARS-CoV-2 testing   Fluid overload BNP normal.  Oxygenation improving with diuresis. -Continue PO Lasix today -Continue strict I/Os -Obtain echo after COVID ruled out  Suspected stroke CT head negative at  admission.  NIHSS 3 on arrival, subsequently 0 to 1 for first 24 hours, remains 0.    MRI, carotids, echo pending until CoV ruled out.  Discussed with Neurology by phone earlier in hospitalization, who were not asked to evaluate the patient due to possible COVID.  They note he was not a tPA candidate at admission, tPA still not indicated.  They recommend MRI brain, on a non-emergent basis, as it's outcome currently would not change management. -Non-invasive angiography pending -Echocardiogram pending  -Lipids ordered: LDL 112, continue Crestor   -Aspirin ordered at admission --> continue ASA -Diabetes: new onset -Atrial fibrillation: Not present, monitoring tele -tPA not given because severity too mild -Dysphagia screen ordered in ER -PT eval ordered: pending due to CoV -Smoking cessation: not pertinent  Acute metabolic encephalopathy This is a hypoactive delirium, waxing and waning.     NSVT No more VT overnight.  Mag and K replete. -Maintain tele -Obtain echo after CoV isolation removed  Type 2 diabetes New diagnosis, HgbA1c 7.3%         MDM and disposition: Below labs and imaging reports reviewed and summarized above.  Medication management as highlighted above.  The patient was admitted with change in mentation, suspected aphasia, but found to have pneumonia and possible COV ID.  His secondary stroke work-up is currently on hold pending coronavirus testing results.  In the last day his oxygen requirements have improved, and we have weaned down to 1 L of supplemental oxygen, I believe as a result of diuresis.  We will continue IV antibiotics, diuretics, and  monitor his coronavirus test  After that, MRI brain before discharge.          DVT prophylaxis: Lovenox Code Status: FULL Family Communication: wife by phone    Consultants:   Neurology  Procedures:   CT head  Antimicrobials:   Ceftriaxone 3/23 >>  Azithromycin 3/23 >>    Subjective: No  headache, no chest pain, no abdominal pain.  No fever, respiratory distress, chest discomfort.  No tachypnea.  No leg swelling.  No orthopnea.  No sputum.   No seizures, focal weakness, numbness.        Objective: Vitals:   09/30/18 0300 09/30/18 0403 09/30/18 0827 09/30/18 1108  BP:   139/78   Pulse:  74 75   Resp:      Temp:   97.7 F (36.5 C)   TempSrc:   Oral   SpO2: 94% 94% 93% 93%  Weight:      Height:        Intake/Output Summary (Last 24 hours) at 09/30/2018 1217 Last data filed at 09/30/2018 1100 Gross per 24 hour  Intake 710 ml  Output 2175 ml  Net -1465 ml   Filed Weights   09/25/18 2245 09/29/18 1800  Weight: 72 kg 73.4 kg    Examination: General appearance: Adult male, lying in bed, interactive, no acute distress.  Sleeping but easily arousable. HEENT: Anicteric, conjunctive are pink, lids and lashes normal.  No nasal deformity, discharge, or epistaxis.  Lips moist, dentition good.  Oropharynx moist, no oral lesions. Skin: Warm and dry, no suspicious rashes or lesions. Cardiac: Heart rate regular, no murmurs, JVP normal, no lower extremity edema. Respiratory: No dependent rales, no wheezing.  Respiratory effort appears normal. Abdomen: Abdomen soft without tenderness or guarding, no distention or rigidity.  No rebound. MSK: Normal muscle bulk and tone  neuro: Face symmetric, extraocular movements intact, speech fluent, palate raises equal, strength 5/5 and symmetric in upper and lower extremities bilaterally, sensation intact light touch. Psych: Stable psychomotor slowing.  Attention currently seems normal.  His recall, recent and remote, seems impaired.    Data Reviewed: I have personally reviewed following labs and imaging studies:  CBC: Recent Labs  Lab 09/25/18 1804 09/26/18 0214 09/27/18 0317 09/28/18 0253 09/30/18 0641  WBC 9.2 9.5 12.3* 12.6* 8.8  NEUTROABS 7.4 7.6 10.5* 10.6*  --   HGB 15.5 14.5 14.0 14.1 14.8  HCT 47.9 43.5 43.5 44.0 47.1   MCV 78.0* 78.5* 78.5* 79.1* 79.6*  PLT 215 212 249 315 370   Basic Metabolic Panel: Recent Labs  Lab 09/25/18 1804 09/26/18 0214 09/27/18 0317 09/28/18 0253 09/30/18 0641  NA 140 139 144 149* 148*  K 4.2 4.1 3.9 3.9 4.1  CL 105 105 110 112* 111  CO2 26 23 25 25 25   GLUCOSE 146* 125* 137* 141* 124*  BUN 19 22 16 18 19   CREATININE 1.06 1.11 1.04 1.04 1.03  CALCIUM 8.7* 8.2* 8.0* 8.3* 8.8*  MG  --   --   --  3.2*  --    GFR: Estimated Creatinine Clearance: 55.6 mL/min (by C-G formula based on SCr of 1.03 mg/dL). Liver Function Tests: Recent Labs  Lab 09/25/18 1804 09/26/18 0214 09/27/18 0317 09/28/18 0253  AST 104* 104* 148* 98*  ALT 139* 130* 150* 131*  ALKPHOS 60 55 60 62  BILITOT 0.6 0.8 0.6 0.6  PROT 8.2* 7.1 7.4 6.6  ALBUMIN 3.3* 3.0* 2.5* 2.4*   No results for input(s): LIPASE, AMYLASE in the last  168 hours. No results for input(s): AMMONIA in the last 168 hours. Coagulation Profile: Recent Labs  Lab 09/25/18 1804  INR 1.0   Cardiac Enzymes: No results for input(s): CKTOTAL, CKMB, CKMBINDEX, TROPONINI in the last 168 hours. BNP (last 3 results) No results for input(s): PROBNP in the last 8760 hours. HbA1C: No results for input(s): HGBA1C in the last 72 hours. CBG: No results for input(s): GLUCAP in the last 168 hours. Lipid Profile: No results for input(s): CHOL, HDL, LDLCALC, TRIG, CHOLHDL, LDLDIRECT in the last 72 hours. Thyroid Function Tests: No results for input(s): TSH, T4TOTAL, FREET4, T3FREE, THYROIDAB in the last 72 hours. Anemia Panel: Recent Labs    09/28/18 0253  FERRITIN 1,579*   Urine analysis:    Component Value Date/Time   COLORURINE YELLOW 03/08/2017 1545   APPEARANCEUR CLOUDY (A) 03/08/2017 1545   LABSPEC 1.025 03/08/2017 1545   PHURINE 6.0 03/08/2017 1545   GLUCOSEU NEGATIVE 03/08/2017 1545   HGBUR LARGE (A) 03/08/2017 1545   BILIRUBINUR NEGATIVE 03/08/2017 1545   KETONESUR NEGATIVE 03/08/2017 1545   PROTEINUR NEGATIVE  03/08/2017 1545   NITRITE POSITIVE (A) 03/08/2017 1545   LEUKOCYTESUR LARGE (A) 03/08/2017 1545   Sepsis Labs: (procalcitonin:4,lacticacidven:4)  ) Recent Results (from the past 240 hour(s))  Blood culture (routine x 2)     Status: None   Collection Time: 09/25/18  8:14 PM  Result Value Ref Range Status   Specimen Description BLOOD RIGHT ANTECUBITAL  Final   Special Requests   Final    BOTTLES DRAWN AEROBIC AND ANAEROBIC Blood Culture adequate volume   Culture   Final    NO GROWTH 5 DAYS Performed at [facility], [address], [place], [place] [address]    Report Status 09/30/2018 FINAL  Final  Blood culture (routine x 2)     Status: None   Collection Time: 09/25/18  8:16 PM  Result Value Ref Range Status   Specimen Description BLOOD LEFT FOREARM  Final   Special Requests   Final    BOTTLES DRAWN AEROBIC ONLY Blood Culture results may not be optimal due to an inadequate volume of blood received in culture bottles   Culture   Final    NO GROWTH 5 DAYS Performed at [facility], [address], [place], [place] [address]    Report Status 09/30/2018 FINAL  Final  Respiratory Panel by PCR     Status: None   Collection Time: 09/26/18  5:36 AM  Result Value Ref Range Status   Adenovirus NOT DETECTED NOT DETECTED Final   Coronavirus 229E NOT DETECTED NOT DETECTED Final    Comment: (NOTE) The Coronavirus on the Respiratory Panel, DOES NOT test for the novel  Coronavirus (2019 nCoV)    Coronavirus HKU1 NOT DETECTED NOT DETECTED Final   Coronavirus NL63 NOT DETECTED NOT DETECTED Final   Coronavirus OC43 NOT DETECTED NOT DETECTED Final   Metapneumovirus NOT DETECTED NOT DETECTED Final   Rhinovirus / Enterovirus NOT DETECTED NOT DETECTED Final   Influenza A NOT DETECTED NOT DETECTED Final   Influenza B NOT DETECTED NOT DETECTED Final   Parainfluenza Virus 1 NOT DETECTED NOT DETECTED Final   Parainfluenza Virus 2 NOT DETECTED NOT DETECTED Final    Parainfluenza Virus 3 NOT DETECTED NOT DETECTED Final   Parainfluenza Virus 4 NOT DETECTED NOT DETECTED Final   Respiratory Syncytial Virus NOT DETECTED NOT DETECTED Final   Bordetella pertussis NOT DETECTED NOT DETECTED Final   Chlamydophila pneumoniae NOT  DETECTED NOT DETECTED Final   Mycoplasma pneumoniae NOT DETECTED NOT DETECTED Final    Comment: Performed at [facility], [address], [place], [place] [address]         Radiology Studies: Dg Abd 1 View  Result Date: 09/29/2018 CLINICAL DATA:  Pneumonia.  Abdominal distention. EXAM: ABDOMEN - 1 VIEW COMPARISON:  None. FINDINGS: The bowel gas pattern is normal. No radio-opaque calculi or other significant radiographic abnormality are seen. IMPRESSION: Negative. Electronically Signed   By: Myles Rosenthal M.D.   On: 09/29/2018 08:20   Dg Chest Port 1 View  Result Date: 09/29/2018 CLINICAL DATA:  Pneumonia, follow-up EXAM: PORTABLE CHEST 1 VIEW COMPARISON:  Portable exam 0750 hours compared to a 09/25/2018 FINDINGS: Upper normal heart size. Mediastinal contour stable. Increased airspace infiltrates in RIGHT lung and in LEFT lower lobe. No pleural effusion or pneumothorax. Endplate spur formation thoracic spine. No acute bony findings. IMPRESSION: Increased airspace infiltrates, asymmetrically greater on RIGHT. Electronically Signed   By: Ulyses Southward M.D.   On: 09/29/2018 08:22        Scheduled Meds: . aspirin EC  81 mg Oral Daily  . azithromycin  500 mg Oral Daily  . furosemide  40 mg Oral BID  . magnesium oxide  400 mg Oral BID  . rosuvastatin  20 mg Oral q1800  . sodium chloride flush  3 mL Intravenous Once  . sodium chloride flush  3 mL Intravenous Q12H   Continuous Infusions: . cefTRIAXone (ROCEPHIN)  IV Stopped (09/29/18 2132)     LOS: 5 days    Time spent: 45 minutes    Alberteen Sam, MD [facility] 09/30/2018, 12:17 PM     Please page through AMION:  [email or web address] Password [email or web address]  7PM-7AM, please contact night-coverage   Date: 09/30/18  12:17 PM Patient Isolation: Droplet + Contact HCP PPE: Facemask, goggles, gown, gloves Patient PPE: none

## 2018-10-01 LAB — CBC
HCT: 48.1 % (ref 39.0–52.0)
Hemoglobin: 15.6 g/dL (ref 13.0–17.0)
MCH: 25.7 pg — ABNORMAL LOW (ref 26.0–34.0)
MCHC: 32.4 g/dL (ref 30.0–36.0)
MCV: 79.1 fL — ABNORMAL LOW (ref 80.0–100.0)
Platelets: 373 10*3/uL (ref 150–400)
RBC: 6.08 MIL/uL — ABNORMAL HIGH (ref 4.22–5.81)
RDW: 14.1 % (ref 11.5–15.5)
WBC: 9.7 10*3/uL (ref 4.0–10.5)
nRBC: 0 % (ref 0.0–0.2)

## 2018-10-01 LAB — BASIC METABOLIC PANEL
Anion gap: 9 (ref 5–15)
BUN: 21 mg/dL (ref 8–23)
CO2: 28 mmol/L (ref 22–32)
Calcium: 8.7 mg/dL — ABNORMAL LOW (ref 8.9–10.3)
Chloride: 109 mmol/L (ref 98–111)
Creatinine, Ser: 1.07 mg/dL (ref 0.61–1.24)
GFR calc Af Amer: >60 mL/min (ref >60)
GFR calc non Af Amer: >60 mL/min (ref >60)
Glucose, Bld: 124 mg/dL — ABNORMAL HIGH (ref 70–99)
Potassium: 4 mmol/L (ref 3.5–5.1)
SODIUM: 146 mmol/L — AB (ref 135–145)

## 2018-10-01 MED ORDER — ENOXAPARIN SODIUM 40 MG/0.4ML ~~LOC~~ SOLN: 40.0000 mg | SUBCUTANEOUS | Status: DC

## 2018-10-01 NOTE — Progress Notes (Signed)
PT Cancellation Note  Patient Details Name: Steven Jimenez MRN: [id number] DOB: 28-Jan-1946   Cancelled Treatment:    Reason Eval/Treat Not Completed: Other (comment) Per health system leadership, therapy services are being held at this time until patient tests negative for COVID-19.      Enedina Finner Lashanta Elbe 10/01/2018, 6:50 AM Delaney Meigs, PT Acute Rehabilitation Services Pager: [phone] Office: [phone]

## 2018-10-01 NOTE — Progress Notes (Signed)
SLP Cancellation Note  Patient Details Name: Steven Jimenez MRN: [id number] DOB: 1945/10/04   Cancelled treatment:       Reason Eval/Treat Not Completed: Medical issues which prohibited therapy. Per health system leadership, therapy services are being held at this time until patient tests negative for COVID-19.     [name], [place], CCC-SLP Speech-Language Pathologist Acute Rehabilitation Services Pager: [phone] Office: [phone]    [name] 10/01/2018, 8:12 AM

## 2018-10-01 NOTE — Progress Notes (Signed)
PROGRESS NOTE    Ranald Batz  VOZ:[id number] DOB: 16-May-1946 DOA: 09/25/2018 PCP: Patient, No Pcp Per      Brief Narrative:  Mr. Cheuk is a 73 y.o. M with no significant PMHx who presented due to fever, cough, and slurred speech which progressed to aphasia.  The patient was in USOH until a few days before admission.  He was caring for wife who had pneumonia, then he himself developed cough, fever, body aches, malaise.  He may have had some slurred speech but subsequently developed (according to duaghter by phone) abrupt onset of inability to speak.  She took him to PCP who ordered flu swab (negative), CXR (showed pneumonia), SARS-CoV-2 NAAT (pending at time of admission) and recommended, due to his inability to speak in their office, that he be evaluated in the ER for possible stroke.  In the ER, he was aphasic, but also hypoxic.  Initial NIHSS 3.  Chest x-ray showed a multifocal pneumonia with patchy airspace opacity in both lungs.  Head CT showed no acute infarct.  He was started on ceftriaxone and azithromycin, SARS-CoV-2 testing was sent.  Per EDP note and admission H&P, his speech symptoms were present for 2 days at admission.  Per daughter on phone, his aphasia was abrupt onset on day of admission, prior to PCP office visit.     Assessment & Plan:  Acute respiratory failure with hypoxia Community-acquired pneumonia, multifocal, bilateral Coronavirus, rule out Blood cultures negative, strep and legionella urine antigens negative.  RVP negative.     Oxygen increased to 3 L again overnight, no respiratory distress, again weaned this morning to 1 L stable. -Continue azithromycin and ceftriaxone, day 6 --> will continue to 7d given worsening CXR -Incentive spirometry -Hold IV fluids -Wean oxygen as able -Follow source SARS-CoV-2 testing   Fluid overload BNP normal.  Oxygenation improving with diuresis.  Net -800 cc again yesterday. -Continue p.o. Lasix -Continue strict  I/Os -Obtain echo after COVID ruled out  Suspected stroke CT head negative at admission.  NIHSS 3 on arrival, subsequently 0 to 1 for first 24 hours, remains 0.    MRI, carotids, echo pending until CoV ruled out.  Discussed with Neurology by phone earlier in hospitalization, who were not asked to evaluate the patient due to possible COVID.  They note he was not a tPA candidate at admission, tPA still not indicated.  They recommend MRI brain, on a non-emergent basis, as it's outcome currently would not change management. -Non-invasive angiography pending -Echocardiogram pending  -Lipids ordered: LDL 112, continue Crestor   -Aspirin ordered at admission --> continue ASA -Diabetes: new onset -Atrial fibrillation: Not present, monitoring tele -tPA not given because severity too mild -Dysphagia screen ordered in ER -PT eval ordered: pending due to CoV -Smoking cessation: not pertinent  Acute metabolic encephalopathy This is a hypoactive delirium, waxing and waning.   Seems stable.  Underlying dementia.  NSVT No more VT overnight.  Mag and K replete. -Maintain tele -Obtain echo after CoV isolation removed  Type 2 diabetes New diagnosis, HgbA1c 7.3%         MDM and disposition: Below labs and imaging reports were reviewed and summarized above.  Medication management as above.  The patient was admitted with change in mentation, suspect aphasia, but found to have a pneumonia and possible COPD.  As a result of systemwide cancellation of nonessential testing, and after discussion with Neurology, his secondary stroke work-up is currently on hold pending coronavirus testing results.   His  oxygen requirements are stable, and we will continue to wean oxygen, diuresis, and IV antibiotics.       DVT prophylaxis: Lovenox Code Status: FULL Family Communication: wife by phone    Consultants:   Neurology  Procedures:   CT head  Antimicrobials:   Ceftriaxone 3/23 >>   Azithromycin 3/23 >>    Subjective: No new chest discomfort, trouble breathing, respiratory distress, feet seizures, focal weakness, numbness, slurred speech.  No cough, sputum.  No tachypnea or respiratory distress.           Objective: Vitals:   10/01/18 0609 10/01/18 0620 10/01/18 0850 10/01/18 1024  BP: (!) 141/81  (!) 145/84   Pulse: 84  96   Resp: 18     Temp: 98.2 F (36.8 C)  98.4 F (36.9 C)   TempSrc: Oral  Oral   SpO2: 92%  94% (!) 87%  Weight:  66.2 kg    Height:        Intake/Output Summary (Last 24 hours) at 10/01/2018 1520 Last data filed at 10/01/2018 1127 Gross per 24 hour  Intake 240 ml  Output 950 ml  Net -710 ml   Filed Weights   09/25/18 2245 09/29/18 1800 10/01/18 0620  Weight: 72 kg 73.4 kg 66.2 kg    Examination: General appearance: Adult male, lying in bed, interactive, no acute distress.  Sleeping but easily arousable. HEENT: Anicteric, conjunctival pink, lids and lashes normal, no nasal deformity, discharge, or epistaxis.  Lips moist, dentition normal.  Oropharynx moist, no oral lesions. Skin: Skin warm and dry without mottling or suspicious rashes or lesions. Cardiac: Rate regular, no murmurs, JVP normal, no lower extremity edema. Respiratory: Lung sounds diminished, no wheezing.  Respiratory effort normal. Abdomen: Abdomen soft without tenderness to palpation or guarding.  No distention or rigidity.  No rebound. MSK: Normal muscle bulk and tone  neuro: Cranial nerves III through XII intact.  Extraocular movements intact, speech fluent.  Strength 5/5 in upper and lower extremities bilaterally.  Sensation intact light touch. Psych: Psychomotor slowing persistent.  Attention seems normal.  His general recall, recent and remote, seems moderately impaired.    Data Reviewed: I have personally reviewed following labs and imaging studies:  CBC: Recent Labs  Lab 09/25/18 1804 09/26/18 0214 09/27/18 0317 09/28/18 0253 09/30/18 0641 10/01/18  0313  WBC 9.2 9.5 12.3* 12.6* 8.8 9.7  NEUTROABS 7.4 7.6 10.5* 10.6*  --   --   HGB 15.5 14.5 14.0 14.1 14.8 15.6  HCT 47.9 43.5 43.5 44.0 47.1 48.1  MCV 78.0* 78.5* 78.5* 79.1* 79.6* 79.1*  PLT 215 212 249 315 370 373   Basic Metabolic Panel: Recent Labs  Lab 09/26/18 0214 09/27/18 0317 09/28/18 0253 09/30/18 0641 10/01/18 0313  NA 139 144 149* 148* 146*  K 4.1 3.9 3.9 4.1 4.0  CL 105 110 112* 111 109  CO2 GLUCOSE 125* 137* 141* 124* 124*  BUN CREATININE 1.11 1.04 1.04 1.03 1.07  CALCIUM 8.2* 8.0* 8.3* 8.8* 8.7*  MG  --   --  3.2*  --   --    GFR: Estimated Creatinine Clearance: 53.5 mL/min (by C-G formula based on SCr of 1.07 mg/dL). Liver Function Tests: Recent Labs  Lab 09/25/18 1804 09/26/18 0214 09/27/18 0317 09/28/18 0253  AST 104* 104* 148* 98*  ALT 139* 130* 150* 131*  ALKPHOS 60 55 60 62  BILITOT 0.6 0.8 0.6 0.6  PROT 8.2*  7.1 7.4 6.6  ALBUMIN 3.3* 3.0* 2.5* 2.4*   No results for input(s): LIPASE, AMYLASE in the last 168 hours. No results for input(s): AMMONIA in the last 168 hours. Coagulation Profile: Recent Labs  Lab 09/25/18 1804  INR 1.0   Cardiac Enzymes: No results for input(s): CKTOTAL, CKMB, CKMBINDEX, TROPONINI in the last 168 hours. BNP (last 3 results) No results for input(s): PROBNP in the last 8760 hours. HbA1C: No results for input(s): HGBA1C in the last 72 hours. CBG: No results for input(s): GLUCAP in the last 168 hours. Lipid Profile: No results for input(s): CHOL, HDL, LDLCALC, TRIG, CHOLHDL, LDLDIRECT in the last 72 hours. Thyroid Function Tests: No results for input(s): TSH, T4TOTAL, FREET4, T3FREE, THYROIDAB in the last 72 hours. Anemia Panel: No results for input(s): VITAMINB12, FOLATE, FERRITIN, TIBC, IRON, RETICCTPCT in the last 72 hours. Urine analysis:    Component Value Date/Time   COLORURINE YELLOW 03/08/2017 1545   APPEARANCEUR CLOUDY (A) 03/08/2017 1545   LABSPEC 1.025  03/08/2017 1545   PHURINE 6.0 03/08/2017 1545   GLUCOSEU NEGATIVE 03/08/2017 1545   HGBUR LARGE (A) 03/08/2017 1545   BILIRUBINUR NEGATIVE 03/08/2017 1545   KETONESUR NEGATIVE 03/08/2017 1545   PROTEINUR NEGATIVE 03/08/2017 1545   NITRITE POSITIVE (A) 03/08/2017 1545   LEUKOCYTESUR LARGE (A) 03/08/2017 1545   Sepsis Labs: (procalcitonin:4,lacticacidven:4)  ) Recent Results (from the past 240 hour(s))  Blood culture (routine x 2)     Status: None   Collection Time: 09/25/18  8:14 PM  Result Value Ref Range Status   Specimen Description BLOOD RIGHT ANTECUBITAL  Final   Special Requests   Final    BOTTLES DRAWN AEROBIC AND ANAEROBIC Blood Culture adequate volume   Culture   Final    NO GROWTH 5 DAYS Performed at [facility], [address], [place], [place] [address]    Report Status 09/30/2018 FINAL  Final  Blood culture (routine x 2)     Status: None   Collection Time: 09/25/18  8:16 PM  Result Value Ref Range Status   Specimen Description BLOOD LEFT FOREARM  Final   Special Requests   Final    BOTTLES DRAWN AEROBIC ONLY Blood Culture results may not be optimal due to an inadequate volume of blood received in culture bottles   Culture   Final    NO GROWTH 5 DAYS Performed at [facility], [address], [place], [place] [address]    Report Status 09/30/2018 FINAL  Final  Respiratory Panel by PCR     Status: None   Collection Time: 09/26/18  5:36 AM  Result Value Ref Range Status   Adenovirus NOT DETECTED NOT DETECTED Final   Coronavirus 229E NOT DETECTED NOT DETECTED Final    Comment: (NOTE) The Coronavirus on the Respiratory Panel, DOES NOT test for the novel  Coronavirus (2019 nCoV)    Coronavirus HKU1 NOT DETECTED NOT DETECTED Final   Coronavirus NL63 NOT DETECTED NOT DETECTED Final   Coronavirus OC43 NOT DETECTED NOT DETECTED Final   Metapneumovirus NOT DETECTED NOT DETECTED Final   Rhinovirus / Enterovirus NOT DETECTED NOT DETECTED Final    Influenza A NOT DETECTED NOT DETECTED Final   Influenza B NOT DETECTED NOT DETECTED Final   Parainfluenza Virus 1 NOT DETECTED NOT DETECTED Final   Parainfluenza Virus 2 NOT DETECTED NOT DETECTED Final   Parainfluenza Virus 3 NOT DETECTED NOT DETECTED Final   Parainfluenza Virus 4 NOT DETECTED NOT DETECTED Final  Respiratory Syncytial Virus NOT DETECTED NOT DETECTED Final   Bordetella pertussis NOT DETECTED NOT DETECTED Final   Chlamydophila pneumoniae NOT DETECTED NOT DETECTED Final   Mycoplasma pneumoniae NOT DETECTED NOT DETECTED Final    Comment: Performed at [facility], [address] [place], [place] [address]         Radiology Studies: No results found.      Scheduled Meds: . aspirin EC  81 mg Oral Daily  . azithromycin  500 mg Oral Daily  . furosemide  40 mg Oral BID  . rosuvastatin  20 mg Oral q1800  . sodium chloride flush  3 mL Intravenous Once  . sodium chloride flush  3 mL Intravenous Q12H   Continuous Infusions: . cefTRIAXone (ROCEPHIN)  IV Stopped (09/30/18 2103)     LOS: 6 days    Time spent: 25 minutes    Alberteen Sam, MD [facility] 10/01/2018, 3:20 PM     Please page through AMION:  [email or web address] Password [email or web address] If 7PM-7AM, please contact night-coverage   Date: 10/01/18  3:20 PM Patient Isolation: Droplet + Contact HCP PPE: Facemask, faceshield, gown, gloves Patient PPE: none

## 2018-10-02 ENCOUNTER — Inpatient Hospital Stay (HOSPITAL_COMMUNITY): Payer: Medicare Other

## 2018-10-02 NOTE — Progress Notes (Signed)
OT Cancellation Note  Patient Details Name: Steven Jimenez MRN: [id number] DOB: [birth year]   Cancelled Treatment:    Reason Eval/Treat Not Completed: Medical issues which prohibited therapy.Per health system leadership, therapy services are being held at this time until patient tests negative for COVID-19   Evern Bio 10/02/2018, 8:29 AM  Martie Round, OTR/L Acute Rehabilitation Services Pager: [phone] Office: [phone]

## 2018-10-02 NOTE — Progress Notes (Signed)
SLP Cancellation Note  Patient Details Name: Steven Jimenez MRN: [id number] DOB: Apr 14, 1946   Cancelled treatment:       Reason Eval/Treat Not Completed: Medical issues which prohibited therapy. Per health system leadership, therapy services are being held at this time until patient tests negative for COVID-19.     [name], [place], CCC-SLP Speech-Language Pathologist Acute Rehabilitation Services Pager: [phone] Office: [phone]    [name] 10/02/2018, 8:18 AM

## 2018-10-02 NOTE — Progress Notes (Signed)
PROGRESS NOTE    Steven Jimenez  UVJ:[id number] DOB: 16-May-1946 DOA: 09/25/2018 PCP: Patient, No Pcp Per      Brief Narrative:  Steven Jimenez is a 73 y.o. M with no significant PMHx who presented due to fever, cough, and slurred speech which progressed to aphasia.  The patient was in [facility] until a few days before admission.  He was caring for wife who had pneumonia, then he himself developed cough, fever, body aches, malaise.  He may have had some slurred speech but subsequently developed (according to duaghter by phone) abrupt onset of inability to speak.  She took him to PCP who ordered flu swab (negative), CXR (showed pneumonia), SARS-CoV-2 NAAT (pending at time of admission) and recommended, due to his inability to speak in their office, that he be evaluated in the ER for possible stroke.  In the ER, he was aphasic, but also hypoxic.  Initial NIHSS 3.  Chest x-ray showed a multifocal pneumonia with patchy airspace opacity in both lungs.  Head CT showed no acute infarct.  He was started on ceftriaxone and azithromycin, SARS-CoV-2 testing was sent.  Per EDP note and admission H&P, his speech symptoms were present for 2 days at admission.  Per daughter on phone, his aphasia was abrupt onset on day of admission, prior to PCP office visit.     Assessment & Plan:  Acute respiratory failure with hypoxia COVID Blood cultures negative, strep and legionella urine antigens negative.  RVP negative.   SARS-CoV-2 NAAT returned positive today.  Oxygen weaned to 1/2L now.  Patient comfortable.   Completed azithromycin and ceftriaxone. -Wean oxygen as able   Fluid overload BNP normal.  Diuresed for 2 days.  O2 improved.    -Hold Lasix  Suspected stroke CT head negative at admission.  NIHSS 3 on arrival, subsequently 0 to 1 for first 24 hours, remains 0.   -Obtain MRI brain  Acute metabolic encephalopathy Improving.  NSVT Intermittent VT, mag and K replete.  -Maintain tele -Obtain echo  after CoV isolation removed  Type 2 diabetes New diagnosis, HgbA1c 7.3%         MDM and disposition: The below labs and imaging reports reviewed and summarized above.  Medication management as above.  The patient is admitted with change in mentation, suspected a fascia, but found to have pneumonia and possible COVID.    As a result of systemwide cancellation of nonessential testing, and after discussion with Neurology, his secondary stroke work-up is currently on hold.  Will obtain MRI brain now.   His oxygen requirements is improving.        DVT prophylaxis: Lovenox Code Status: FULL Family Communication: Wife and daughter by phone    Consultants:   Neurology  Procedures:   CT head  Antimicrobials:   Ceftriaxone 3/23 >>  Azithromycin 3/23 >>    Subjective: No new fever, malaise, dyspnea, cough, congestion of the chest.  No focal weakness, numbness, slurred speech.           Objective: Vitals:   10/01/18 2300 10/02/18 0500 10/02/18 0809 10/02/18 1541  BP: 136/85  (!) 145/87 (!) 148/87  Pulse: 90  86 88  Resp: 16  18 16   Temp: 98.2 F (36.8 C)  98.3 F (36.8 C) (!) 97.4 F (36.3 C)  TempSrc: Oral  Oral Oral  SpO2: 92%  92% 93%  Weight:  65.8 kg    Height:        Intake/Output Summary (Last 24 hours) at 10/02/2018 1619  Last data filed at 10/01/2018 2100 Gross per 24 hour  Intake 120 ml  Output 600 ml  Net -480 ml   Filed Weights   09/29/18 1800 10/01/18 0620 10/02/18 0500  Weight: 73.4 kg 66.2 kg 65.8 kg    Examination: General appearance: Adult male, lying in bed, interactive, no acute distress. HEENT: Anicteric, conjunctival pink, lids and lashes normal.  No nasal deformity, discharge, or epistaxis.  Lips moist, dentition normal.  Oropharynx moist, no oral lesions. Skin: Skin warm and dry, without mottling or suspicious rashes or lesions. Cardiac: Giller rate and rhythm, no murmurs, JVP normal, no lower extremity edema Respiratory:  Lung sounds diminished, no wheezing.  Respiratory effort normal.  Nasal cannula in place. Abdomen: Soft without tenderness to palpation or guarding.  No distention or rigidity.  No rebound. MSK: Normal muscle bulk and tone  neuro: Cranial nerves III through XII intact.  Extraocular movements intact, speech fluent.  Strength 5/5 in upper and lower extremities bilaterally.  Sensation intact light touch. Psych: Psychomotor slowing is persistent, attention seems normal, general recall, recent and remote, seems moderately impaired.    Data Reviewed: I have personally reviewed following labs and imaging studies:  CBC: Recent Labs  Lab 09/25/18 1804 09/26/18 0214 09/27/18 0317 09/28/18 0253 09/30/18 0641 10/01/18 0313  WBC 9.2 9.5 12.3* 12.6* 8.8 9.7  NEUTROABS 7.4 7.6 10.5* 10.6*  --   --   HGB 15.5 14.5 14.0 14.1 14.8 15.6  HCT 47.9 43.5 43.5 44.0 47.1 48.1  MCV 78.0* 78.5* 78.5* 79.1* 79.6* 79.1*  PLT 215 212 249 315 370 373   Basic Metabolic Panel: Recent Labs  Lab 09/26/18 0214 09/27/18 0317 09/28/18 0253 09/30/18 0641 10/01/18 0313  NA 139 144 149* 148* 146*  K 4.1 3.9 3.9 4.1 4.0  CL 105 110 112* 111 109  CO2 23 25 25 25 28   GLUCOSE 125* 137* 141* 124* 124*  BUN 22 16 18 19 21   CREATININE 1.11 1.04 1.04 1.03 1.07  CALCIUM 8.2* 8.0* 8.3* 8.8* 8.7*  MG  --   --  3.2*  --   --    GFR: Estimated Creatinine Clearance: 53.5 mL/min (by C-G formula based on SCr of 1.07 mg/dL). Liver Function Tests: Recent Labs  Lab 09/25/18 1804 09/26/18 0214 09/27/18 0317 09/28/18 0253  AST 104* 104* 148* 98*  ALT 139* 130* 150* 131*  ALKPHOS 60 55 60 62  BILITOT 0.6 0.8 0.6 0.6  PROT 8.2* 7.1 7.4 6.6  ALBUMIN 3.3* 3.0* 2.5* 2.4*   No results for input(s): LIPASE, AMYLASE in the last 168 hours. No results for input(s): AMMONIA in the last 168 hours. Coagulation Profile: Recent Labs  Lab 09/25/18 1804  INR 1.0   Cardiac Enzymes: No results for input(s): CKTOTAL, CKMB,  CKMBINDEX, TROPONINI in the last 168 hours. BNP (last 3 results) No results for input(s): PROBNP in the last 8760 hours. HbA1C: No results for input(s): HGBA1C in the last 72 hours. CBG: No results for input(s): GLUCAP in the last 168 hours. Lipid Profile: No results for input(s): CHOL, HDL, LDLCALC, TRIG, CHOLHDL, LDLDIRECT in the last 72 hours. Thyroid Function Tests: No results for input(s): TSH, T4TOTAL, FREET4, T3FREE, THYROIDAB in the last 72 hours. Anemia Panel: No results for input(s): VITAMINB12, FOLATE, FERRITIN, TIBC, IRON, RETICCTPCT in the last 72 hours. Urine analysis:    Component Value Date/Time   COLORURINE YELLOW 03/08/2017 1545   APPEARANCEUR CLOUDY (A) 03/08/2017 1545   LABSPEC 1.025 03/08/2017 1545  PHURINE 6.0 03/08/2017 1545   GLUCOSEU NEGATIVE 03/08/2017 1545   HGBUR LARGE (A) 03/08/2017 1545   BILIRUBINUR NEGATIVE 03/08/2017 1545   KETONESUR NEGATIVE 03/08/2017 1545   PROTEINUR NEGATIVE 03/08/2017 1545   NITRITE POSITIVE (A) 03/08/2017 1545   LEUKOCYTESUR LARGE (A) 03/08/2017 1545   Sepsis Labs: (procalcitonin:4,lacticacidven:4)  ) Recent Results (from the past 240 hour(s))  Blood culture (routine x 2)     Status: None   Collection Time: 09/25/18  8:14 PM  Result Value Ref Range Status   Specimen Description BLOOD RIGHT ANTECUBITAL  Final   Special Requests   Final    BOTTLES DRAWN AEROBIC AND ANAEROBIC Blood Culture adequate volume   Culture   Final    NO GROWTH 5 DAYS Performed at [facility], [address], [place], [place] [address]    Report Status 09/30/2018 FINAL  Final  Blood culture (routine x 2)     Status: None   Collection Time: 09/25/18  8:16 PM  Result Value Ref Range Status   Specimen Description BLOOD LEFT FOREARM  Final   Special Requests   Final    BOTTLES DRAWN AEROBIC ONLY Blood Culture results may not be optimal due to an inadequate volume of blood received in culture bottles   Culture   Final    NO  GROWTH 5 DAYS Performed at [facility], [address], [place], [place] [address]    Report Status 09/30/2018 FINAL  Final  Respiratory Panel by PCR     Status: None   Collection Time: 09/26/18  5:36 AM  Result Value Ref Range Status   Adenovirus NOT DETECTED NOT DETECTED Final   Coronavirus 229E NOT DETECTED NOT DETECTED Final    Comment: (NOTE) The Coronavirus on the Respiratory Panel, DOES NOT test for the novel  Coronavirus (2019 nCoV)    Coronavirus HKU1 NOT DETECTED NOT DETECTED Final   Coronavirus NL63 NOT DETECTED NOT DETECTED Final   Coronavirus OC43 NOT DETECTED NOT DETECTED Final   Metapneumovirus NOT DETECTED NOT DETECTED Final   Rhinovirus / Enterovirus NOT DETECTED NOT DETECTED Final   Influenza A NOT DETECTED NOT DETECTED Final   Influenza B NOT DETECTED NOT DETECTED Final   Parainfluenza Virus 1 NOT DETECTED NOT DETECTED Final   Parainfluenza Virus 2 NOT DETECTED NOT DETECTED Final   Parainfluenza Virus 3 NOT DETECTED NOT DETECTED Final   Parainfluenza Virus 4 NOT DETECTED NOT DETECTED Final   Respiratory Syncytial Virus NOT DETECTED NOT DETECTED Final   Bordetella pertussis NOT DETECTED NOT DETECTED Final   Chlamydophila pneumoniae NOT DETECTED NOT DETECTED Final   Mycoplasma pneumoniae NOT DETECTED NOT DETECTED Final    Comment: Performed at [facility], [address], [place], [place] [address]         Radiology Studies: No results found.      Scheduled Meds:  aspirin EC  81 mg Oral Daily   rosuvastatin  20 mg Oral q1800   Continuous Infusions:    LOS: 7 days    Time spent: 25 minutes    Alberteen Sam, MD [facility] 10/02/2018, 4:19 PM     Please page through AMION:  [email or web address] If 7PM-7AM, please contact night-coverage   Date: 10/02/18  4:19 PM Patient Isolation: Droplet + Contact HCP PPE: Surgical mask, faceshield, gown, gloves Patient PPE: none

## 2018-10-02 NOTE — Plan of Care (Signed)
  Problem: Health Behavior/Discharge Planning: Goal: Ability to manage health-related needs will improve Outcome: Progressing   

## 2018-10-03 NOTE — Progress Notes (Signed)
OT Screen  Spoke with pt over the phone. He states he lives with his wife in a 2 level home with bedroom upstairs. His daughter comes by daily to "check on them". He has a cane, RW and shower chair. States he has been walking in his hospital room without the assistance of staff to go to the bathroom and feels like he is "returning to normal". States his speech is at baseline.  Educated on fall prevention/energy conservation and use of shower chair for bathing and recommend use of cane as needed. Written information regarding fall prevention and general HEP given to tech to take into patients room. Pt asking when he will be discharged.   Please call with further questions. Thanks Luisa Dago, OT/L   Acute OT Clinical Specialist Acute Rehabilitation Services Pager [phone] Office [phone]

## 2018-10-03 NOTE — Progress Notes (Signed)
Nsg Discharge Note  Admit Date:  09/25/2018 Discharge date: 10/03/2018   Ewing Passariello to be D/C'd  per MD order.   Patient/caregiver able to verbalize understanding.  Discharge Medication: Allergies as of 10/03/2018   No Known Allergies     Medication List    STOP taking these medications   HYDROcodone-acetaminophen 5-325 MG tablet Commonly known as:  NORCO/VICODIN            Durable Medical Equipment  (From admission, onward)         Start     Ordered   10/03/18 1048  DME Oxygen  Once    Question Answer Comment  Mode or (Route) Nasal cannula   Liters per Minute 2   Frequency Continuous (stationary and portable oxygen unit needed)   Oxygen delivery system Gas      10/03/18 1114          Discharge Assessment: Vitals:   10/02/18 2143 10/03/18 0917  BP: (!) 150/91 (!) 127/91  Pulse: 85 93  Resp:    Temp:    SpO2: 96% 92%   Skin clean, dry and intact without evidence of skin break down, no evidence of skin tears noted. IV catheter discontinued intact. Site without signs and symptoms of complications - no redness or edema noted at insertion site, patient denies c/o pain - only slight tenderness at site.  Dressing with slight pressure applied.  D/c Instructions-Education: Discharge instructions given to patient/family with verbalized understanding. D/c education completed with patient/family including follow up instructions, medication list, d/c activities limitations if indicated, with other d/c instructions as indicated by MD - patient able to verbalize understanding, all questions fully answered. Patient instructed to return to ED, call 911, or call MD for any changes in condition.  Patient escorted via WC, and D/C home via private auto.  Vernis Cabacungan, Tilford Pillar, RN 10/03/2018 3:22 PM

## 2018-10-03 NOTE — Care Management Important Message (Signed)
Important Message  Patient Details  Name: Steven Jimenez MRN: [id number] Date of Birth: 1946/06/17   Medicare Important Message Given:  Yes I Letha Cape RN spoke with Leslie Dales, patients daughter , explained form and she is agreeable to verbal signature.   Leone Haven, RN 10/03/2018, 11:43 AM

## 2018-10-03 NOTE — Discharge Summary (Signed)
Physician Discharge Summary  Steven Jimenez NWG:[id number] DOB: [birth year] DOA: 09/25/2018  PCP: Patient, No Pcp Per  Admit date: 09/25/2018 Discharge date: 10/03/2018  Admitted From: Home  Disposition:  Home   Recommendations for Outpatient Follow-up:  1. Follow obtain new PCP 2. Please follow up with Health Department daily 3. Wean oxygen as able 4. Follow up with Cardiology in 2-4 weeks regarding nonsustained ventricular tachycardias 5. Please initiate therapy for diabetes    Home Health: Yes  Equipment/Devices: Oxygen  Discharge Condition: Fair  CODE STATUS: FULL Diet recommendation: Regular  Brief/Interim Summary: Steven Jimenez a 73 y.o. M with no significant PMHx who presented due to fever, cough, and slurred speech which progressed to aphasia.  The patient was in [facility] until a few days before admission.  He was caring for wife who had pneumonia, then he himself developed cough, fever, body aches, malaise.  He may have had some slurred speech but subsequently developed (according to daughter by phone) onset of inability to speak.  Daughter took him to PCP who ordered flu swab (negative), CXR (showed pneumonia), SARS-CoV-2 NAAT (pending at time of admission) and recommended, due to his inability to speak in their office, that he be evaluated in the ER for possible stroke.  In the ER, he was aphasic, but also hypoxic.  Initial NIHSS 3.  Chest x-ray showed a multifocal pneumonia with patchy airspace opacity in both lungs.  Head CT showed no acute infarct.  He was started on ceftriaxone and azithromycin, SARS-CoV-2 testing was sent again.  Per EDP note and admission H&P, his speech symptoms were present for 2 days at admission.  Case was discussed with Neurology, who recommended no tPA due to duration of symptoms, mild extent of symptoms.  Patient was admitted to hospitalist service for acute hypoxic respiratory failure.     PRINCIPAL HOSPITAL  DIAGNOSIS: COVID-19    Discharge Diagnoses:   Acute respiratory failure with hypoxia due to COVID-19 Patient admitted and supported on supplemental O2.  Started on empiric ceftriaxone and azithromycin.  Blood cultures negative, strep and legionella urine antigens negative.  RVP negative.     SARS-CoV-2 NAAT positive.  Patient initially did require uptitration of O2 to 3L, although in the last 3 days, we have consistently been able to wean the patient to 1/2L O2 and he has been stable.    Completed azithromycin and ceftriaxone 7 days.  Case discussed with Health Department, who approved below recommendations for patient to return home with wife, daughter.  They will follow patient at home.    Instructions given to wean O2.    Fluid overload BNP normal.  Diuresed for 2 days.  O2 improved.   No Lasix at discharge.  Stroke ruled out Acute metabolic encephalopathy Patient initially somewhat slowed in responses, confused about situation.  At no point was there   CT head negative at admission.  NIHSS 3 on arrival (due to slow verbal responses, possible slurred speech), subsequently 0 to 1 for first 24 hours, subsequently remained 0.  At no point did he have focal weakness, numbness, gaze preference, garbled speech, visual changes.  Initially, case discussed with Neurology, who recommended MRI on non-emergent basis due to suspected COVID.  When MRI obtained, this showed no acute infarct.  It appears clear in retrospect that the patient had an acute metabolic encephalopathy from his pneumonia.    Non-sustained ventricular tachycardia Patient with occasional brief non-sustained and hemodynamically stable ventricular tachycardia on monitor.  No Afib.  Mag and potassium  were repleted.  He should have an echocardiogram and cardiology follow up after CoV isolation is completed. -Cardiology referral sent.  Type 2 diabetes New diagnosis, HgbA1c 7.3% -Follow up with new  PCP           Discharge Instructions  Discharge Instructions    Diet general   Complete by:  As directed    Discharge instructions   Complete by:  As directed    From Dr. Maryfrances Bunnell: You were admitted to the hospital with fever, cough, and trouble speaking. In the ER, your chest x-ray showed a pneumonia, and we discovered that you needed oxygen.  At first we thought this might be a regular normal bacterial pneumonia.  In the end, your Novel Coronavirus test was positive, and so we were able to confirm that all your symptoms were from coronavirus (or "COVID-19") actually.  You were treated with oxygen as well as azithromycin and ceftriaxone (which are standard treatments for typical community acquired bacterial pneumonia, because we weren't sure at first if that was what your symptoms were from).  While in the hospital, your lungs have slowly started to recover, and we were able to taper down from 3 liters of oxygen per minute down (which is a lot) to 1/2 liter per minute (which is barely any).  For now:  Use oxygen up to 2Liters per minute at all times to keep your oxygen level at or above 90%.  (It's okay to be as low as 88%, but for sure keep it above 88%)  Discuss with the health department when you will be able to reduce your oxygen. My recommendation would be to purchase a pulse oximeter at [facility], [facility] or [employer] (look for pulse oximeter, portable pulse oximeter or fingertip pulse oximeters, all of which fit on the end of your finger, and give you an oxygen reading) When you are able to take the oxygen off and keep your oxygen level at or above 90% for 10 minutes even if you are walking around, you may stop wearing the oxygen.  Keep in mind, your body needs less oxygen when you are sitting still than when you are walking, so still need oxygen while walking around or exerting yourself even after your oxygen level is normal off oxygen while sitting quietly.    Some important  incidental findings: Stroke was rule out When you arrived, you were having trouble speaking.  The doctors in the ER were correct to recommend an MRI to determine if you had a stroke; it took the hospital a while to arrange a protocol for safely performing non-urgent MRI tests on patient's with coronavirus, but when we were finally able to obtain that test, it showed (thankfully) no signs of a stroke at all.  MRI is very sensitive for strokes, and so I suspect that the symptoms you had when you arrived (not being able to spaek well, and seeming confused) were not from anything like a stroke, but from "encephalopathy" from pneumonia.  Encephalopathy is a general term we use to describe temporary brain dysfunction from an underlying illness, like a pneumonia in your case.  No further work up is needed in this case.   Diabetes You also tested positive for diabetes while you were here.   (Diabetes is a common cause of strokes, and so before we were sure that you hadn't had one, we ordered a diabetes test, called a "hemoglobin A1c").  Your hemoglobin A1c was abnormal, 7.3%, indicating mild diabetes.  You should follow  this up with a primary care doctor.  Abnormal heart rhythm We also noticed that you had some brief abnormal heart rhythms.   These are likely nothing, but I recommend that you get them followed up with a heart specialist after the health department has cleared you of Coronavirus to go back out in public.    I have attached elsewhere in this document some isolation guidelines from the [facility].  However, the key points: -It is safe for someone in your family to stay with you at your house for a little while, if you need some extra help right now -That person (or persons) MUST stay as isolated from you as possible though -They should sleep in a separate room and attempt to stay 6 feet away from you at all times  -They and YOU should wear a mask at all times -They should  clean surfaces in the house with disinfectant several times a day  If they do NOT stay in your house, but help you with cooking and laundry: -They should leave food for you on the porch if possible -If they have to do laundry for you, they should wear a mask and gloves for laundry before it is washed and wash the laundry on the highest heat possible -They should not clean your dishes, but should try to use disposable plates/utensils   Increase activity slowly   Complete by:  As directed      Allergies as of 10/03/2018   No Known Allergies     Medication List    STOP taking these medications   HYDROcodone-acetaminophen 5-325 MG tablet Commonly known as:  NORCO/VICODIN            Durable Medical Equipment  (From admission, onward)         Start     Ordered   10/03/18 1048  DME Oxygen  Once    Question Answer Comment  Mode or (Route) Nasal cannula   Liters per Minute 2   Frequency Continuous (stationary and portable oxygen unit needed)   Oxygen delivery system Gas      10/03/18 1114          No Known Allergies  Consultations:  Neurology  Infectious disease   Procedures/Studies: Dg Abd 1 View  Result Date: 09/29/2018 CLINICAL DATA:  Pneumonia.  Abdominal distention. EXAM: ABDOMEN - 1 VIEW COMPARISON:  None. FINDINGS: The bowel gas pattern is normal. No radio-opaque calculi or other significant radiographic abnormality are seen. IMPRESSION: Negative. Electronically Signed   By: Myles RosenthalJohn  Stahl M.D.   On: 09/29/2018 08:20   Ct Head Wo Contrast  Result Date: 09/25/2018 CLINICAL DATA:  Slurred speech and altered mental status EXAM: CT HEAD WITHOUT CONTRAST TECHNIQUE: Contiguous axial images were obtained from the base of the skull through the vertex without intravenous contrast. COMPARISON:  None. FINDINGS: Brain: There is mild age related volume loss. There is no intracranial mass, hemorrhage, extra-axial fluid collection, or midline shift. There is slight small vessel  disease in the centra semiovale bilaterally. Brain parenchyma otherwise appears unremarkable. No acute infarct evident. Vascular: There is no hyperdense vessel. There is calcification in each carotid siphon region. Skull: The bony calvarium appears intact. Sinuses/Orbits: Paranasal sinuses are clear. Orbits appear symmetric bilaterally. Other: Mastoid air cells are clear. IMPRESSION: Mild age related volume loss with slight periventricular small vessel disease. No acute infarct. No mass or hemorrhage. There are foci of arterial vascular calcification. Electronically Signed   By: Bretta BangWilliam  Woodruff  III M.D.   On: 09/25/2018 18:53   Mr Brain Wo Contrast  Result Date: 10/02/2018 CLINICAL DATA:  Aphasia EXAM: MRI HEAD WITHOUT CONTRAST TECHNIQUE: Multiplanar, multiecho pulse sequences of the brain and surrounding structures were obtained without intravenous contrast. COMPARISON:  Head CT 09/25/2018 FINDINGS: BRAIN: There is no acute infarct, acute hemorrhage or extra-axial collection. The midline structures are normal. No midline shift or other mass effect. Multifocal white matter hyperintensity, most commonly due to chronic ischemic microangiopathy. The cerebral and cerebellar volume are age-appropriate. No hydrocephalus. Susceptibility-sensitive sequences show no chronic microhemorrhage or superficial siderosis. No mass lesion. VASCULAR: The major intracranial arterial and venous sinus flow voids are normal. SKULL AND UPPER CERVICAL SPINE: Calvarial bone marrow signal is normal. There is no skull base mass. Visualized upper cervical spine and soft tissues are normal. SINUSES/ORBITS: No fluid levels or advanced mucosal thickening. No mastoid or middle ear effusion. The orbits are normal. IMPRESSION: Chronic small vessel ischemia without acute abnormality. Electronically Signed   By: Deatra Robinson M.D.   On: 10/02/2018 17:46   Dg Chest Port 1 View  Result Date: 09/29/2018 CLINICAL DATA:  Pneumonia, follow-up EXAM:  PORTABLE CHEST 1 VIEW COMPARISON:  Portable exam 0750 hours compared to a 09/25/2018 FINDINGS: Upper normal heart size. Mediastinal contour stable. Increased airspace infiltrates in RIGHT lung and in LEFT lower lobe. No pleural effusion or pneumothorax. Endplate spur formation thoracic spine. No acute bony findings. IMPRESSION: Increased airspace infiltrates, asymmetrically greater on RIGHT. Electronically Signed   By: Ulyses Southward M.D.   On: 09/29/2018 08:22   Dg Chest Portable 1 View  Result Date: 09/25/2018 CLINICAL DATA:  Shortness of breath with cough and fever EXAM: PORTABLE CHEST 1 VIEW COMPARISON:  None. FINDINGS: There is patchy airspace opacity in the right mid lung and both base regions. Heart is upper normal in size with pulmonary vascularity normal. No adenopathy. No bone lesions. IMPRESSION: Multifocal pneumonia with patchy airspace opacity in both lung bases and right mid lung regions. Atypical pneumonia including viral pneumonia can present in this manner. No adenopathy evident. Electronically Signed   By: Bretta Bang III M.D.   On: 09/25/2018 20:01       Subjective: No dyspnea, cough, chest discomfort, malaise, myalgias, confusion.  Discharge Exam: Vitals:   10/02/18 2143 10/03/18 0917  BP: (!) 150/91 (!) 127/91  Pulse: 85 93  Resp:    Temp:    SpO2: 96% 92%   Vitals:   10/02/18 0809 10/02/18 1541 10/02/18 2143 10/03/18 0917  BP: (!) 145/87 (!) 148/87 (!) 150/91 (!) 127/91  Pulse: 86 88 85 93  Resp: 18 16    Temp: 98.3 F (36.8 C) (!) 97.4 F (36.3 C)    TempSrc: Oral Oral    SpO2: 92% 93% 96% 92%  Weight:      Height:        General: Pt is alert, awake, not in acute distress, oriented to hospital Cardiovascular: RRR, nl S1-S2, no murmurs appreciated.   No LE edema.   Respiratory: Normal respiratory rate and rhythm.  CTAB without rales or wheezes.   Abdominal: Abdomen soft and non-tender.  No distension or HSM.   Neuro/Psych: Strength symmetric in upper  and lower extremities.  Judgment and insight appear normal, although psychomotor slowing is noted.   The results of significant diagnostics from this hospitalization (including imaging, microbiology, ancillary and laboratory) are listed below for reference.     Microbiology: Recent Results (from the past 240 hour(s))  Blood  culture (routine x 2)     Status: None   Collection Time: 09/25/18  8:14 PM  Result Value Ref Range Status   Specimen Description BLOOD RIGHT ANTECUBITAL  Final   Special Requests   Final    BOTTLES DRAWN AEROBIC AND ANAEROBIC Blood Culture adequate volume   Culture   Final    NO GROWTH 5 DAYS Performed at [facility], [address], [place], [place] [address]    Report Status 09/30/2018 FINAL  Final  Blood culture (routine x 2)     Status: None   Collection Time: 09/25/18  8:16 PM  Result Value Ref Range Status   Specimen Description BLOOD LEFT FOREARM  Final   Special Requests   Final    BOTTLES DRAWN AEROBIC ONLY Blood Culture results may not be optimal due to an inadequate volume of blood received in culture bottles   Culture   Final    NO GROWTH 5 DAYS Performed at [facility], [address] [place], [place] [address]    Report Status 09/30/2018 FINAL  Final  Respiratory Panel by PCR     Status: None   Collection Time: 09/26/18  5:36 AM  Result Value Ref Range Status   Adenovirus NOT DETECTED NOT DETECTED Final   Coronavirus 229E NOT DETECTED NOT DETECTED Final    Comment: (NOTE) The Coronavirus on the Respiratory Panel, DOES NOT test for the novel  Coronavirus (2019 nCoV)    Coronavirus HKU1 NOT DETECTED NOT DETECTED Final   Coronavirus NL63 NOT DETECTED NOT DETECTED Final   Coronavirus OC43 NOT DETECTED NOT DETECTED Final   Metapneumovirus NOT DETECTED NOT DETECTED Final   Rhinovirus / Enterovirus NOT DETECTED NOT DETECTED Final   Influenza A NOT DETECTED NOT DETECTED Final   Influenza B NOT DETECTED NOT DETECTED Final    Parainfluenza Virus 1 NOT DETECTED NOT DETECTED Final   Parainfluenza Virus 2 NOT DETECTED NOT DETECTED Final   Parainfluenza Virus 3 NOT DETECTED NOT DETECTED Final   Parainfluenza Virus 4 NOT DETECTED NOT DETECTED Final   Respiratory Syncytial Virus NOT DETECTED NOT DETECTED Final   Bordetella pertussis NOT DETECTED NOT DETECTED Final   Chlamydophila pneumoniae NOT DETECTED NOT DETECTED Final   Mycoplasma pneumoniae NOT DETECTED NOT DETECTED Final    Comment: Performed at [facility], [address] [place], [place] [address]     Labs: BNP (last 3 results) Recent Labs    09/30/18 0641  BNP 26.6   Basic Metabolic Panel: Recent Labs  Lab 09/27/18 0317 09/28/18 0253 09/30/18 0641 10/01/18 0313  NA 144 149* 148* 146*  K 3.9 3.9 4.1 4.0  CL 110 112* 111 109  CO2 GLUCOSE 137* 141* 124* 124*  BUN CREATININE 1.04 1.04 1.03 1.07  CALCIUM 8.0* 8.3* 8.8* 8.7*  MG  --  3.2*  --   --    Liver Function Tests: Recent Labs  Lab 09/27/18 0317 09/28/18 0253  AST 148* 98*  ALT 150* 131*  ALKPHOS 60 62  BILITOT 0.6 0.6  PROT 7.4 6.6  ALBUMIN 2.5* 2.4*   No results for input(s): LIPASE, AMYLASE in the last 168 hours. No results for input(s): AMMONIA in the last 168 hours. CBC: Recent Labs  Lab 09/27/18 0317 09/28/18 0253 09/30/18 0641 10/01/18 0313  WBC 12.3* 12.6* 8.8 9.7  NEUTROABS 10.5* 10.6*  --   --   HGB 14.0 14.1  14.8 15.6  HCT 43.5 44.0 47.1 48.1  MCV 78.5* 79.1* 79.6* 79.1*  PLT 249 315 370 373   Cardiac Enzymes: No results for input(s): CKTOTAL, CKMB, CKMBINDEX, TROPONINI in the last 168 hours. BNP: Invalid input(s): POCBNP CBG: No results for input(s): GLUCAP in the last 168 hours. D-Dimer No results for input(s): DDIMER in the last 72 hours. Hgb A1c No results for input(s): HGBA1C in the last 72 hours. Lipid Profile No results for input(s): CHOL, HDL, LDLCALC, TRIG, CHOLHDL, LDLDIRECT in the last 72 hours. Thyroid  function studies No results for input(s): TSH, T4TOTAL, T3FREE, THYROIDAB in the last 72 hours.  Invalid input(s): FREET3 Anemia work up No results for input(s): VITAMINB12, FOLATE, FERRITIN, TIBC, IRON, RETICCTPCT in the last 72 hours. Urinalysis    Component Value Date/Time   COLORURINE YELLOW 03/08/2017 1545   APPEARANCEUR CLOUDY (A) 03/08/2017 1545   LABSPEC 1.025 03/08/2017 1545   PHURINE 6.0 03/08/2017 1545   GLUCOSEU NEGATIVE 03/08/2017 1545   HGBUR LARGE (A) 03/08/2017 1545   BILIRUBINUR NEGATIVE 03/08/2017 1545   KETONESUR NEGATIVE 03/08/2017 1545   PROTEINUR NEGATIVE 03/08/2017 1545   NITRITE POSITIVE (A) 03/08/2017 1545   LEUKOCYTESUR LARGE (A) 03/08/2017 1545   Sepsis Labs Invalid input(s): PROCALCITONIN,  WBC,  LACTICIDVEN Microbiology Recent Results (from the past 240 hour(s))  Blood culture (routine x 2)     Status: None   Collection Time: 09/25/18  8:14 PM  Result Value Ref Range Status   Specimen Description BLOOD RIGHT ANTECUBITAL  Final   Special Requests   Final    BOTTLES DRAWN AEROBIC AND ANAEROBIC Blood Culture adequate volume   Culture   Final    NO GROWTH 5 DAYS Performed at [facility], [address], [place], [place] [address]    Report Status 09/30/2018 FINAL  Final  Blood culture (routine x 2)     Status: None   Collection Time: 09/25/18  8:16 PM  Result Value Ref Range Status   Specimen Description BLOOD LEFT FOREARM  Final   Special Requests   Final    BOTTLES DRAWN AEROBIC ONLY Blood Culture results may not be optimal due to an inadequate volume of blood received in culture bottles   Culture   Final    NO GROWTH 5 DAYS Performed at [facility], [address] [place], [place] [address]    Report Status 09/30/2018 FINAL  Final  Respiratory Panel by PCR     Status: None   Collection Time: 09/26/18  5:36 AM  Result Value Ref Range Status   Adenovirus NOT DETECTED NOT DETECTED Final   Coronavirus 229E NOT DETECTED NOT  DETECTED Final    Comment: (NOTE) The Coronavirus on the Respiratory Panel, DOES NOT test for the novel  Coronavirus (2019 nCoV)    Coronavirus HKU1 NOT DETECTED NOT DETECTED Final   Coronavirus NL63 NOT DETECTED NOT DETECTED Final   Coronavirus OC43 NOT DETECTED NOT DETECTED Final   Metapneumovirus NOT DETECTED NOT DETECTED Final   Rhinovirus / Enterovirus NOT DETECTED NOT DETECTED Final   Influenza A NOT DETECTED NOT DETECTED Final   Influenza B NOT DETECTED NOT DETECTED Final   Parainfluenza Virus 1 NOT DETECTED NOT DETECTED Final   Parainfluenza Virus 2 NOT DETECTED NOT DETECTED Final   Parainfluenza Virus 3 NOT DETECTED NOT DETECTED Final   Parainfluenza Virus 4 NOT DETECTED NOT DETECTED Final   Respiratory Syncytial Virus NOT DETECTED NOT DETECTED Final   Bordetella pertussis  NOT DETECTED NOT DETECTED Final   Chlamydophila pneumoniae NOT DETECTED NOT DETECTED Final   Mycoplasma pneumoniae NOT DETECTED NOT DETECTED Final    Comment: Performed at [facility], [address] [place], [place] [address]     Time coordinating discharge: 90 minutes      SIGNED:   Alberteen Sam, MD  [facility] 10/03/2018, 11:17 AM

## 2018-10-03 NOTE — Discharge Instructions (Signed)
Person Under Monitoring Name: Steven Jimenez  Location: [address] [place] [address]   Infection Prevention Recommendations for Individuals Confirmed to have, or Being Evaluated for, 2019 Novel Coronavirus (COVID-19) Infection Who Receive Care at Home  Individuals who are confirmed to have, or are being evaluated for, COVID-19 should follow the prevention steps below until a healthcare provider or local or state health department says they can return to normal activities.  Stay home except to get medical care You should restrict activities outside your home, except for getting medical care. Do not go to work, school, or public areas, and do not use public transportation or taxis.  Call ahead before visiting your doctor Before your medical appointment, call the healthcare provider and tell them that you have, or are being evaluated for, COVID-19 infection. This will help the healthcare providers office take steps to keep other people from getting infected. Ask your healthcare provider to call the local or state health department.  Monitor your symptoms Seek prompt medical attention if your illness is worsening (e.g., difficulty breathing). Before going to your medical appointment, call the healthcare provider and tell them that you have, or are being evaluated for, COVID-19 infection. Ask your healthcare provider to call the local or state health department.  Wear a facemask You should wear a facemask that covers your nose and mouth when you are in the same room with other people and when you visit a healthcare provider. People who live with or visit you should also wear a facemask while they are in the same room with you.  Separate yourself from other people in your home As much as possible, you should stay in a different room from other people in your home. Also, you should use a separate bathroom, if available.  Avoid sharing household items You should not share  dishes, drinking glasses, cups, eating utensils, towels, bedding, or other items with other people in your home. After using these items, you should wash them thoroughly with soap and water.  Cover your coughs and sneezes Cover your mouth and nose with a tissue when you cough or sneeze, or you can cough or sneeze into your sleeve. Throw used tissues in a lined trash can, and immediately wash your hands with soap and water for at least 20 seconds or use an alcohol-based hand rub.  Wash your [employer] your hands often and thoroughly with soap and water for at least 20 seconds. You can use an alcohol-based hand sanitizer if soap and water are not available and if your hands are not visibly dirty. Avoid touching your eyes, nose, and mouth with unwashed hands.   Prevention Steps for Caregivers and Household Members of Individuals Confirmed to have, or Being Evaluated for, COVID-19 Infection Being Cared for in the Home  If you live with, or provide care at home for, a person confirmed to have, or being evaluated for, COVID-19 infection please follow these guidelines to prevent infection:  Follow healthcare providers instructions Make sure that you understand and can help the patient follow any healthcare provider instructions for all care.  Provide for the patients basic needs You should help the patient with basic needs in the home and provide support for getting groceries, prescriptions, and other personal needs.  Monitor the patients symptoms If they are getting sicker, call his or her medical provider and tell them that the patient has, or is being evaluated for, COVID-19 infection. This will help the healthcare providers office take  steps to keep other people from getting infected. Ask the healthcare provider to call the local or state health department.  Limit the number of people who have contact with the patient  If possible, have only one caregiver for the patient.  Other  household members should stay in another home or place of residence. If this is not possible, they should stay  in another room, or be separated from the patient as much as possible. Use a separate bathroom, if available.  Restrict visitors who do not have an essential need to be in the home.  Keep older adults, very young children, and other sick people away from the patient Keep older adults, very young children, and those who have compromised immune systems or chronic health conditions away from the patient. This includes people with chronic heart, lung, or kidney conditions, diabetes, and cancer.  Ensure good ventilation Make sure that shared spaces in the home have good air flow, such as from an air conditioner or an opened window, weather permitting.  Wash your hands often  Wash your hands often and thoroughly with soap and water for at least 20 seconds. You can use an alcohol based hand sanitizer if soap and water are not available and if your hands are not visibly dirty.  Avoid touching your eyes, nose, and mouth with unwashed hands.  Use disposable paper towels to dry your hands. If not available, use dedicated cloth towels and replace them when they become wet.  Wear a facemask and gloves  Wear a disposable facemask at all times in the room and gloves when you touch or have contact with the patients blood, body fluids, and/or secretions or excretions, such as sweat, saliva, sputum, nasal mucus, vomit, urine, or feces.  Ensure the mask fits over your nose and mouth tightly, and do not touch it during use.  Throw out disposable facemasks and gloves after using them. Do not reuse.  Wash your hands immediately after removing your facemask and gloves.  If your personal clothing becomes contaminated, carefully remove clothing and launder. Wash your hands after handling contaminated clothing.  Place all used disposable facemasks, gloves, and other waste in a lined container before  disposing them with other household waste.  Remove gloves and wash your hands immediately after handling these items.  Do not share dishes, glasses, or other household items with the patient  Avoid sharing household items. You should not share dishes, drinking glasses, cups, eating utensils, towels, bedding, or other items with a patient who is confirmed to have, or being evaluated for, COVID-19 infection.  After the person uses these items, you should wash them thoroughly with soap and water.  Wash laundry thoroughly  Immediately remove and wash clothes or bedding that have blood, body fluids, and/or secretions or excretions, such as sweat, saliva, sputum, nasal mucus, vomit, urine, or feces, on them.  Wear gloves when handling laundry from the patient.  Read and follow directions on labels of laundry or clothing items and detergent. In general, wash and dry with the warmest temperatures recommended on the label.  Clean all areas the individual has used often  Clean all touchable surfaces, such as counters, tabletops, doorknobs, bathroom fixtures, toilets, phones, keyboards, tablets, and bedside tables, every day. Also, clean any surfaces that may have blood, body fluids, and/or secretions or excretions on them.  Wear gloves when cleaning surfaces the patient has come in contact with.  Use a diluted bleach solution (e.g., dilute bleach with 1 part bleach  and 10 parts water) or a household disinfectant with a label that says EPA-registered for coronaviruses. To make a bleach solution at home, add 1 tablespoon of bleach to 1 quart (4 cups) of water. For a larger supply, add  cup of bleach to 1 gallon (16 cups) of water.  Read labels of cleaning products and follow recommendations provided on product labels. Labels contain instructions for safe and effective use of the cleaning product including precautions you should take when applying the product, such as wearing gloves or eye protection  and making sure you have good ventilation during use of the product.  Remove gloves and wash hands immediately after cleaning.  Monitor yourself for signs and symptoms of illness Caregivers and household members are considered close contacts, should monitor their health, and will be asked to limit movement outside of the home to the extent possible. Follow the monitoring steps for close contacts listed on the symptom monitoring form.   ? If you have additional questions, contact your local health department [phone]  ? This guidance is subject to change. Call your local health department.

## 2018-10-03 NOTE — Progress Notes (Signed)
SATURATION QUALIFICATIONS: (This note is used to comply with regulatory documentation for home oxygen)  Patient Saturations on Room Air at Rest = 93%  Patient Saturations on Room Air while Ambulating = 85%  Patient Saturations on 0.5 Liters of oxygen while Ambulating = 92%  :

## 2018-10-03 NOTE — TOC Transition Note (Addendum)
Transition of Care [facility]) - CM/SW Discharge Note   Patient Details  Name: Steven Jimenez MRN: [id number] Date of Birth: January 11, 1946  [facility] of [facility]) CM/SW Contact:  Steven Haven, RN Phone Number: 10/03/2018, 11:56 AM   Clinical Narrative:    From home with wife, patient gave permission to speak with daughter and wife.  Daughter , Steven Jimenez chose [facility] for HHRN, HHPT, HHOT, HHST .  Referral given to [facility] with Frances Furbish, informed him patient is postive for covid-19, he states they are seeing positive covid -19 patients.  Soc will begin 24-48 hrs post discharge.  Also patient will need home oxygen.  Referral made to [facility] with [employer], he will bring the oxygen tank to patients room and facilitate the set up at home.  Also patient does not have a PCP, Dr. Maryfrances Bunnell will sign off orders for patient right now but NCM printed off list for patient family to look into getting him a PCP.   Final next level of care: Home w Home Health Services Barriers to Discharge: No Barriers Identified   Patient Goals and CMS Choice Patient states their goals for this hospitalization and ongoing recovery are:: go home ,get better CMS Medicare.gov Compare Post Acute Care list provided to:: Other (Comment Required)(daughter) Choice offered to / list presented to : Adult Children  Discharge Placement                       Discharge Plan and Services   Discharge Planning Services: CM Consult Post Acute Care Choice: Durable Medical Equipment, Home Health          DME Arranged: Oxygen DME Agency: [facility] HH Arranged: RN, PT, OT, Speech Therapy HH Agency: [facility]   Social Determinants of Health (SDOH) Interventions     Readmission Risk Interventions No flowsheet data found.

## 2018-10-05 LAB — LEGIONELLA PNEUMOPHILA SEROGP 1 UR AG: L. pneumophila Serogp 1 Ur Ag: NEGATIVE

## 2018-10-05 LAB — NOVEL CORONAVIRUS, NAA (HOSP ORDER, SEND-OUT TO REF LAB; TAT 18-24 HRS): SARS-CoV-2, NAA: DETECTED — AB

## 2018-10-30 ENCOUNTER — Telehealth: Payer: Self-pay | Admitting: Cardiology

## 2018-10-30 NOTE — Telephone Encounter (Signed)
LVM for pre reg °

## 2018-10-30 NOTE — Progress Notes (Unsigned)
{Choose 1 Note Type (Telehealth Visit or Telephone Visit):[phone]}   Evaluation Performed:  Follow-up visit  Date:  10/30/2018   ID:  Steven LootsLeroy Mcmonigle, DOB 02/22/1946, MRN [id number]  {Patient [phone]::"Home"} {Provider [phone]}  PCP:  Patient, No Pcp Per  Cardiologist:  No primary care provider on file. *** Electrophysiologist:  None   Chief Complaint:  ***  History of Present Illness:    Steven Jimenez is a 73 y.o. male who is referred by *** for evaluation of ventricular tachycardia.  ***  The patient was in the hospital recently.   He had pneumonia.   He was positive for SARS-CoV 2.  During he was found to have runs of NSVT.  I reviewed these notes and the rhythm strips.  He has a RBBB and there was a wide complex rhythm that had some irregularity and could have been atrial fib vs NSVT.     The patient {does/does not:200015} have symptoms concerning for COVID-19 infection (fever, chills, cough, or new shortness of breath).    Past Medical History:  Diagnosis Date  . Enlarged prostate    No past surgical history on file.   No outpatient medications have been marked as taking for the 10/31/18 encounter (Appointment) with Rollene RotundaHochrein, Rosy Estabrook, MD.     Allergies:   Patient has no known allergies.   Social History   Tobacco Use  . Smoking status: Never Smoker  . Smokeless tobacco: Never Used  Substance Use Topics  . Alcohol use: Yes    Comment: occ  . Drug use: No     Family Hx: The patient's Family history is unknown by patient.  ROS:   Please see the history of present illness.    *** All other systems reviewed and are negative.   Prior CV studies:   The following studies were reviewed today:  ***  Labs/Other Tests and Data Reviewed:    EKG:  {EKG/Telemetry Strips [phone]}  Recent Labs: 09/28/2018: ALT 131; Magnesium 3.2 09/30/2018: B Natriuretic Peptide 26.6 10/01/2018: BUN 21; Creatinine, Ser 1.07; Hemoglobin 15.6;  Platelets 373; Potassium 4.0; Sodium 146   Recent Lipid Panel Lab Results  Component Value Date/Time   CHOL 175 09/26/2018 02:14 AM   TRIG 172 (H) 09/26/2018 02:14 AM   HDL 29 (L) 09/26/2018 02:14 AM   CHOLHDL 6.0 09/26/2018 02:14 AM   LDLCALC 112 (H) 09/26/2018 02:14 AM    Wt Readings from Last 3 Encounters:  10/02/18 145 lb 1 oz (65.8 kg)  03/08/17 175 lb 0.7 oz (79.4 kg)     Objective:    Vital Signs:  There were no vitals taken for this visit.   {HeartCare Virtual Exam (Optional):[phone]::"VITAL SIGNS:  reviewed"}  ASSESSMENT & PLAN:    NSVT:  Wide complex tachycardia.  I did review the records.   This was while he was acutely ill with COVID 19.  He will need an echo and event recorder.  ***   DM:  This was a new diagnosis.    COVID-19 Education: The signs and symptoms of COVID-19 were discussed with the patient and how to seek care for testing (follow up with PCP or arrange E-visit).  ***The importance of social distancing was discussed today.  Time:   Today, I have spent *** minutes with the patient with telehealth technology discussing the above problems.     Medication Adjustments/Labs and Tests Ordered: Current medicines are reviewed at length with the patient today.  Concerns regarding medicines are outlined above.   Tests Ordered:  No orders of the defined types were placed in this encounter.   Medication Changes: No orders of the defined types were placed in this encounter.   Disposition:  Follow up {follow up:15908}  Signed, Rollene Rotunda, MD  10/30/2018 7:26 PM    [facility]

## 2018-10-31 ENCOUNTER — Telehealth: Payer: Medicare Other | Admitting: Cardiology

## 2018-10-31 ENCOUNTER — Telehealth: Payer: Self-pay | Admitting: *Deleted

## 2018-10-31 NOTE — Telephone Encounter (Addendum)
Called and spoke with patient. He was on Dr Hochrein's schedule today for a virtual visit. Patient's wife stated they were unaware of this appointment. She said it was best to speak with their daughter, Phil Dopp, because she handles scheduling appointments for them.  Called patient's daughter, Phil Dopp, as advised. She was also unaware of this appointment as she wasn't the one who scheduled it. She fears that someone is using her father's information and that is fraudulent. I advised that this appointment was scheduled on Saturday 10/28/18 by Nevin Bloodgood. Daughter is concerned and will be looking into this.

## 2018-12-13 ENCOUNTER — Encounter (INDEPENDENT_AMBULATORY_CARE_PROVIDER_SITE_OTHER): Payer: Medicare Other | Admitting: Ophthalmology

## 2019-01-03 NOTE — Telephone Encounter (Signed)
Open n error °

## 2019-03-13 ENCOUNTER — Other Ambulatory Visit (HOSPITAL_COMMUNITY): Payer: Self-pay | Admitting: Cardiology

## 2019-03-13 ENCOUNTER — Other Ambulatory Visit: Payer: Self-pay | Admitting: Cardiology

## 2019-03-13 DIAGNOSIS — R079 Chest pain, unspecified: Secondary | ICD-10-CM

## 2019-03-21 ENCOUNTER — Encounter (HOSPITAL_COMMUNITY): Payer: Medicare Other

## 2019-03-28 ENCOUNTER — Ambulatory Visit (HOSPITAL_COMMUNITY)
Admission: RE | Admit: 2019-03-28 | Discharge: 2019-03-28 | Disposition: A | Discharge status: Home / Self Care | Payer: Medicare Other | Source: Ambulatory Visit | Attending: Cardiology | Admitting: Cardiology

## 2019-03-28 ENCOUNTER — Other Ambulatory Visit: Payer: Self-pay

## 2019-03-28 DIAGNOSIS — R079 Chest pain, unspecified: Secondary | ICD-10-CM

## 2019-03-28 MED ORDER — TECHNETIUM TC 99M TETROFOSMIN IV KIT
10.0000 | PACK | Freq: Once | INTRAVENOUS | Status: AC | PRN
  Administered 2019-03-28: 11:00:00 10 via INTRAVENOUS

## 2019-03-28 MED ORDER — TECHNETIUM TC 99M TETROFOSMIN IV KIT
30.0000 | PACK | Freq: Once | INTRAVENOUS | Status: AC | PRN
  Administered 2019-03-28: 30 via INTRAVENOUS

## 2019-03-28 MED ORDER — REGADENOSON 0.4 MG/5ML IV SOLN
0.4000 mg | Freq: Once | INTRAVENOUS | Status: AC
  Administered 2019-03-28: 12:00:00 0.4 mg via INTRAVENOUS

## 2019-03-28 MED ORDER — REGADENOSON 0.4 MG/5ML IV SOLN
INTRAVENOUS | Status: AC
  Administered 2019-03-28: 12:00:00 0.4 mg via INTRAVENOUS
  Filled 2019-03-28: qty 5

## 2019-05-28 ENCOUNTER — Other Ambulatory Visit: Payer: Self-pay | Admitting: Internal Medicine

## 2019-05-29 ENCOUNTER — Other Ambulatory Visit: Payer: Self-pay | Admitting: Internal Medicine

## 2019-05-29 DIAGNOSIS — R0989 Other specified symptoms and signs involving the circulatory and respiratory systems: Secondary | ICD-10-CM

## 2019-06-08 ENCOUNTER — Encounter: Payer: Self-pay | Admitting: Physical Therapy

## 2019-06-08 ENCOUNTER — Ambulatory Visit (INDEPENDENT_AMBULATORY_CARE_PROVIDER_SITE_OTHER): Payer: Medicare Other | Admitting: Physical Therapy

## 2019-06-08 ENCOUNTER — Other Ambulatory Visit: Payer: Self-pay

## 2019-06-08 DIAGNOSIS — M545 Low back pain: Secondary | ICD-10-CM

## 2019-06-08 DIAGNOSIS — G8929 Other chronic pain: Secondary | ICD-10-CM | POA: Diagnosis not present

## 2019-06-08 DIAGNOSIS — M6281 Muscle weakness (generalized): Secondary | ICD-10-CM | POA: Diagnosis not present

## 2019-06-08 DIAGNOSIS — R293 Abnormal posture: Secondary | ICD-10-CM

## 2019-06-08 NOTE — Therapy (Signed)
[facility] [address] [place], [place], [address] Phone: [phone]   Fax:  [phone]  Physical Therapy Evaluation  Patient Details  Name: Steven Jimenez MRN: [id number] Date of Birth: 1945-10-26 Referring Provider (PT): Dr. Apolonio Jimenez   Encounter Date: 06/08/2019  PT End of Session - 06/08/19 1201    Visit Number  1    Number of Visits  12    Date for PT Re-Evaluation  07/20/19    PT Start Time  0945   pt arrived late   PT Stop Time  1015    PT Time Calculation (min)  30 min    Activity Tolerance  Patient tolerated treatment well    Behavior During Therapy  [facility] for tasks assessed/performed       Past Medical History:  Diagnosis Date  . Enlarged prostate     History reviewed. No pertinent surgical history.  There were no vitals filed for this visit.   Subjective Assessment - 06/08/19 0948    Subjective  Pt is a 73 y/o male who presents to OPPT for LBP.  Pt also reports "one leg goes numb" but he is unable to recall which leg it is.  Pt reports back pain with standing x many years.  Pt is a poor historian and daughter present, but unable to provide specific details.    Pertinent History  mild CVA, COVID(+) in March    Limitations  Standing    How long can you stand comfortably?  pain with transitional movements    Patient Stated Goals  "I don't know"    Currently in Pain?  Yes    Pain Score  0-No pain   up to 4-5/10 (pt difficult with identifying numbers)   Pain Location  Back    Pain Orientation  Lower    Pain Descriptors / Indicators  Sharp;Pressure    Pain Type  Chronic pain    Pain Onset  More than a month ago    Pain Frequency  Intermittent    Aggravating Factors   transition from sit to stand    Pain Relieving Factors  repositioning, "ease up slowly"         [facility] PT Assessment - 06/08/19 0954      Assessment   Medical Diagnosis  LBP    Referring Provider (PT)  Dr. Apolonio Jimenez    Onset Date/Surgical Date   --   chronic   Hand Dominance  Right    Prior Therapy  after hospitalization in March 2020      Precautions   Precautions  None      Restrictions   Weight Bearing Restrictions  No      Balance Screen   Has the patient fallen in the past 6 months  No    Has the patient had a decrease in activity level because of a fear of falling?   No    Is the patient reluctant to leave their home because of a fear of falling?   No      Home Environment   Living Environment  Private residence    Living Arrangements  Spouse/significant other   Steven Jimenez   Type of [place] to enter    Entrance Stairs-Number of Steps  3    Home Layout  Two level    Alternate Level Stairs-Number of Steps  5    Additional Comments  denies difficulty with stair  Prior Function   Level of Independence  Independent    Vocation  Retired    [employer]  [employer] - retired    Leisure  play pool -unable currently      Cognition   Overall Cognitive Status  History of cognitive impairments - at baseline      Posture/Postural Control   Posture/Postural Control  Postural limitations    Postural Limitations  Rounded Shoulders;Forward head;Decreased lumbar lordosis      ROM / Strength   AROM / PROM / Strength  AROM;Strength      AROM   AROM Assessment Site  Lumbar    Lumbar Flexion  limited 50%    Lumbar Extension  limted 25%    Lumbar - Right Side Bend  WNL with pain    Lumbar - Left Side Bend  WNL with pain    Lumbar - Right Rotation  limited 25%    Lumbar - Left Rotation  limited 25%      Strength   Overall Strength  Deficits   grossly 3-4/5 lower extremities     Flexibility   Soft Tissue Assessment /Muscle Length  yes    Hamstrings  tightness bil; pt resisting with testing    Piriformis  tightness bil    Quadratus Lumborum  tightness bil      Palpation   Palpation comment  deferred due to time constraints      Special Tests   Other special tests  deferred  due to time constraints                Objective measurements completed on examination: See above findings.      [facility] Adult PT Treatment/Exercise - 06/08/19 1157      Self-Care   Self-Care  Other Self-Care Comments    Other Self-Care Comments   pt's daughter present, and requesting that PT "do the exercises for the pt since he won't do them at home"  Discussed with pt and daughter, the role of PT and goals of therapy including working on independence with exercises at home, and for therapy to be most effective and beneficial we need active participate from the patient.  Daughter expressed frustrations with caring for both parents, working, and caring for her children as well.  Discussed options with her including reaching out to PCP for home health assistance, or even looking into PACE program.  Daughter very interested in both options.  Will reach out to PCP about concerns.  Verbally reviewed HEP with pt/daughter and advised to work on at home - unable to perform today due to time constraints             PT Education - 06/08/19 1201    Education Details  HEP, see self care    Person(s) Educated  Patient;Child(ren)    Methods  Explanation;Handout    Comprehension  Verbalized understanding;Need further instruction          PT Long Term Goals - 06/08/19 1206      PT LONG TERM GOAL #1   Title  pt/caregiver report compliance with HEP    Status  New    Target Date  07/20/19      PT LONG TERM GOAL #2   Title  improve lumbar mobility by </= 25% limited all planes without pain for improved function    Status  New    Target Date  07/20/19      PT LONG TERM GOAL #3   Title  perform sit to/from stand without increase in pain for improved function    Status  New    Target Date  07/20/19             Plan - 06/08/19 1202    Clinical Impression Statement  Pt is a 73 y/o male who presents to OPPT for chronic LBP.  Difficult to assess today as pt arrived late to  session, and cognitive deficits make history difficult to follow.  He expresses some radicular symptoms but unsure of laterality or severity as he was unable to remember.  Pt with significantly decreased flexibility and mild strength limitations affecting functional mobility.  Pt will benefit from PT to address deficits listed.    Personal Factors and Comorbidities  Comorbidity 3+;Age    Comorbidities  per daughter: COVID (+) in March with decreased memory, possible CVA in March, decreased cognition, HTN    Examination-Activity Limitations  Stand;Locomotion Level    Stability/Clinical Decision Making  Evolving/Moderate complexity    Clinical Decision Making  Moderate    Rehab Potential  Good    PT Frequency  2x / week    PT Duration  6 weeks    PT Treatment/Interventions  ADLs/Self Care Home Management;Cryotherapy;Electrical Stimulation;Traction;Moist Heat;Ultrasound;Gait training;Stair training;Functional mobility training;Therapeutic activities;Therapeutic exercise;Patient/family education;Neuromuscular re-education;Manual techniques;Passive range of motion;Dry needling;Taping    PT Next Visit Plan  review stretches and progress exercises, general strengthening/endurance activities    PT Home Exercise Plan  Access Code: [email or web address]    Consulted and Agree with Plan of Care  Patient       Patient will benefit from skilled therapeutic intervention in order to improve the following deficits and impairments:  Pain, Increased fascial restricitons, Increased muscle spasms, Postural dysfunction, Impaired flexibility, Decreased strength, Decreased range of motion  Visit Diagnosis: Chronic midline low back pain without sciatica - Plan: PT plan of care cert/re-cert  Abnormal posture - Plan: PT plan of care cert/re-cert  Muscle weakness (generalized) - Plan: PT plan of care cert/re-cert     Problem List Patient Active Problem List   Diagnosis Date Noted  . Acute respiratory failure with hypoxia  (HCC) 09/25/2018  . Multifocal pneumonia 09/25/2018  . Aphasia 09/25/2018      Clarita Crane, PT, DPT 06/08/19 12:10 PM    [facility] [address] [place], [place], [address] Phone: [phone]   Fax:  [phone]  Name: Steven Jimenez MRN: [id number] Date of Birth: 1945/07/30

## 2019-06-08 NOTE — Patient Instructions (Signed)
Access Code: [email or web address]  URL: [email or web address]  Date: 06/08/2019  Prepared by: Faustino Congress   Exercises Hooklying Single Knee to Chest - 3 reps - 1 sets - 30 sec hold - 2x daily - 7x weekly Supine Piriformis Stretch with Foot on Ground - 3 reps - 1 sets - 30 sec hold - 2x daily - 7x weekly Supine Lower Trunk Rotation - 3 reps - 1 sets - 30 sec hold - 2x daily - 7x weekly Seated Hamstring Stretch - 3 reps - 1 sets - 30 sec hold - 2x daily - 7x weekly

## 2019-06-11 ENCOUNTER — Other Ambulatory Visit: Payer: Self-pay | Admitting: Internal Medicine

## 2019-06-11 ENCOUNTER — Other Ambulatory Visit: Payer: Medicare Other

## 2019-06-19 ENCOUNTER — Other Ambulatory Visit: Payer: Medicare Other

## 2019-06-26 ENCOUNTER — Encounter: Payer: Medicare Other | Admitting: Physical Therapy

## 2019-06-27 ENCOUNTER — Ambulatory Visit
Admission: RE | Admit: 2019-06-27 | Discharge: 2019-06-27 | Disposition: A | Discharge status: Home / Self Care | Payer: Medicare Other | Source: Ambulatory Visit | Attending: Internal Medicine | Admitting: Internal Medicine

## 2019-06-27 DIAGNOSIS — R0989 Other specified symptoms and signs involving the circulatory and respiratory systems: Secondary | ICD-10-CM

## 2019-07-03 ENCOUNTER — Telehealth: Payer: Self-pay | Admitting: Physical Therapy

## 2019-07-03 ENCOUNTER — Encounter: Payer: Medicare Other | Admitting: Physical Therapy

## 2019-07-03 NOTE — Telephone Encounter (Signed)
Spoke with pt's daughter due to no show for appointment.  Reminded of next appointment and to call if unable to make it.  Daughter verbalized understanding. Laureen Abrahams, PT, DPT 07/03/19 10:45 AM

## 2019-07-05 ENCOUNTER — Encounter: Payer: Medicare Other | Admitting: Physical Therapy

## 2019-07-10 ENCOUNTER — Ambulatory Visit (INDEPENDENT_AMBULATORY_CARE_PROVIDER_SITE_OTHER): Payer: Medicare Other | Admitting: Physical Therapy

## 2019-07-10 ENCOUNTER — Other Ambulatory Visit: Payer: Self-pay

## 2019-07-10 ENCOUNTER — Encounter: Payer: Self-pay | Admitting: Physical Therapy

## 2019-07-10 DIAGNOSIS — M6281 Muscle weakness (generalized): Secondary | ICD-10-CM

## 2019-07-10 DIAGNOSIS — M545 Low back pain, unspecified: Secondary | ICD-10-CM

## 2019-07-10 DIAGNOSIS — R293 Abnormal posture: Secondary | ICD-10-CM

## 2019-07-10 DIAGNOSIS — G8929 Other chronic pain: Secondary | ICD-10-CM | POA: Diagnosis not present

## 2019-07-10 NOTE — Therapy (Addendum)
[facility] [address] [place], [place], [address] Phone: [phone]   Fax:  [phone]  Physical Therapy Treatment/Discharge Summary  Patient Details  Name: Steven Jimenez MRN: [id number] Date of Birth: 12/08/1945 Referring Provider (PT): Dr. Andrew Lamb   Encounter Date: 07/10/2019  PT End of Session - 07/10/19 1045    Visit Number  2    Number of Visits  12    Date for PT Re-Evaluation  08/10/19   extended due to missed weeks   PT Start Time  1015    PT Stop Time  1056    PT Time Calculation (min)  41 min    Activity Tolerance  Patient tolerated treatment well    Behavior During Therapy  WFL for tasks assessed/performed       Past Medical History:  Diagnosis Date  . Enlarged prostate     History reviewed. No pertinent surgical history.  There were no vitals filed for this visit.  Subjective Assessment - 07/10/19 1016    Subjective  doing well today; no pain.  returns to PT after missing 1 month (schedule and no show x 2)    Pertinent History  mild CVA, COVID(+) in March    Limitations  Standing    How long can you stand comfortably?  pain with transitional movements    Patient Stated Goals  "I don't know"    Currently in Pain?  No/denies    Pain Onset  More than a month ago                       [facility] Adult PT Treatment/Exercise - 07/10/19 1018      Exercises   Exercises  Lumbar      Lumbar Exercises: Stretches   Passive Hamstring Stretch  Right;Left;3 reps;60 seconds    Single Knee to Chest Stretch  Right;Left;3 reps;30 seconds    Lower Trunk Rotation  2 reps;30 seconds    Lower Trunk Rotation Limitations  bil    Piriformis Stretch  Right;Left;3 reps;30 seconds      Lumbar Exercises: Aerobic   Nustep  L4 x 8 min      Lumbar Exercises: Seated   Sit to Stand  10 reps    Sit to Stand Limitations  without UE support      Lumbar Exercises: Supine   Bridge  10 reps;5 seconds      Lumbar Exercises:  Sidelying   Clam  Both;10 reps                  PT Long Term Goals - 06/08/19 1206      PT LONG TERM GOAL #1   Title  pt/caregiver report compliance with HEP    Status  New    Target Date  07/20/19      PT LONG TERM GOAL #2   Title  improve lumbar mobility by </= 25% limited all planes without pain for improved function    Status  New    Target Date  07/20/19      PT LONG TERM GOAL #3   Title  perform sit to/from stand without increase in pain for improved function    Status  New    Target Date  07/20/19            Plan - 07/10/19 1059    Clinical Impression Statement  Pt tolerated session well today without significant increase in pain.  Needs mod cues for HEP.    No goals met as only 2nd visit.    Personal Factors and Comorbidities  Comorbidity 3+;Age    Comorbidities  per daughter: COVID (+) in March with decreased memory, possible CVA in March, decreased cognition, HTN    Examination-Activity Limitations  Stand;Locomotion Level    Stability/Clinical Decision Making  Evolving/Moderate complexity    Rehab Potential  Good    PT Frequency  2x / week    PT Duration  6 weeks    PT Treatment/Interventions  ADLs/Self Care Home Management;Cryotherapy;Electrical Stimulation;Traction;Moist Heat;Ultrasound;Gait training;Stair training;Functional mobility training;Therapeutic activities;Therapeutic exercise;Patient/family education;Neuromuscular re-education;Manual techniques;Passive range of motion;Dry needling;Taping    PT Next Visit Plan  review stretches and progress exercises, general strengthening/endurance activities    PT Home Exercise Plan  Access Code: [email or web address]    Consulted and Agree with Plan of Care  Patient       Patient will benefit from skilled therapeutic intervention in order to improve the following deficits and impairments:  Pain, Increased fascial restricitons, Increased muscle spasms, Postural dysfunction, Impaired flexibility, Decreased strength,  Decreased range of motion  Visit Diagnosis: Chronic midline low back pain without sciatica  Abnormal posture  Muscle weakness (generalized)     Problem List Patient Active Problem List   Diagnosis Date Noted  . Acute respiratory failure with hypoxia (HCC) 09/25/2018  . Multifocal pneumonia 09/25/2018  . Aphasia 09/25/2018      F , PT, DPT 07/10/19 11:01 AM    [facility] [address] [place], [place], [address] Phone: [phone]   Fax:  [phone]  Name: Steven Jimenez MRN: [id number] Date of Birth: 06/17/1946      PHYSICAL THERAPY DISCHARGE SUMMARY  Visits from Start of Care: 2  Current functional level related to goals / functional outcomes: See above   Remaining deficits: See above   Education / Equipment: HEP  Plan: Patient agrees to discharge.  Patient goals were not met. Patient is being discharged due to not returning since the last visit.  ?????     F , PT, DPT 09/13/19 2:26 PM  [facility] [address] [place], [place], [address] Phone: [phone]   Fax:  [phone]  

## 2019-07-13 ENCOUNTER — Encounter: Payer: Medicare Other | Admitting: Physical Therapy

## 2019-07-17 ENCOUNTER — Encounter: Payer: Medicare Other | Admitting: Physical Therapy

## 2019-07-19 ENCOUNTER — Encounter: Payer: Medicare Other | Admitting: Physical Therapy

## 2019-07-31 ENCOUNTER — Other Ambulatory Visit (HOSPITAL_BASED_OUTPATIENT_CLINIC_OR_DEPARTMENT_OTHER): Payer: Self-pay

## 2019-07-31 DIAGNOSIS — G4730 Sleep apnea, unspecified: Secondary | ICD-10-CM

## 2019-08-11 ENCOUNTER — Other Ambulatory Visit (HOSPITAL_COMMUNITY): Admission: RE | Admit: 2019-08-11 | Payer: Medicare Other | Source: Ambulatory Visit

## 2019-08-13 ENCOUNTER — Encounter (HOSPITAL_BASED_OUTPATIENT_CLINIC_OR_DEPARTMENT_OTHER): Payer: Medicare Other | Admitting: Internal Medicine

## 2019-08-18 ENCOUNTER — Other Ambulatory Visit (HOSPITAL_COMMUNITY)
Admission: RE | Admit: 2019-08-18 | Discharge: 2019-08-18 | Disposition: A | Discharge status: Home / Self Care | Payer: Medicare Other | Source: Ambulatory Visit | Attending: Internal Medicine | Admitting: Internal Medicine

## 2019-08-18 DIAGNOSIS — Z20822 Contact with and (suspected) exposure to covid-19: Secondary | ICD-10-CM | POA: Insufficient documentation

## 2019-08-18 DIAGNOSIS — Z01812 Encounter for preprocedural laboratory examination: Secondary | ICD-10-CM | POA: Diagnosis present

## 2019-08-18 LAB — SARS CORONAVIRUS 2 (TAT 6-24 HRS): SARS Coronavirus 2: NEGATIVE

## 2019-08-21 ENCOUNTER — Other Ambulatory Visit: Payer: Self-pay

## 2019-08-21 ENCOUNTER — Ambulatory Visit (HOSPITAL_BASED_OUTPATIENT_CLINIC_OR_DEPARTMENT_OTHER): Payer: Medicare Other | Attending: Internal Medicine | Admitting: Internal Medicine

## 2019-08-21 DIAGNOSIS — G4730 Sleep apnea, unspecified: Secondary | ICD-10-CM | POA: Insufficient documentation

## 2019-08-21 DIAGNOSIS — R0681 Apnea, not elsewhere classified: Secondary | ICD-10-CM

## 2019-08-25 DIAGNOSIS — G4733 Obstructive sleep apnea (adult) (pediatric): Secondary | ICD-10-CM

## 2019-08-25 NOTE — Procedures (Signed)
    Patient Name: Steven Jimenez, Steven Jimenez Date: 08/21/2019 Gender: Male D.O.B: [birth year] Age (years): 52 Referring Provider: Not Available Height (inches): 65 Interpreting Physician: Jetty Duhamel MD, ABSM Weight (lbs): 175 RPSGT: Shelah Lewandowsky BMI: 29 MRN: [id number] Neck Size: 17.75  CLINICAL INFORMATION Sleep Study Type: NPSG Indication for sleep study: Daytime Fatigue, Hypertension, Snoring, Witnesses Apnea / Gasping During Sleep Epworth Sleepiness Score: 12  SLEEP STUDY TECHNIQUE As per the AASM Manual for the Scoring of Sleep and Associated Events v2.3 (April 2016) with a hypopnea requiring 4% desaturations.  The channels recorded and monitored were frontal, central and occipital EEG, electrooculogram (EOG), submentalis EMG (chin), nasal and oral airflow, thoracic and abdominal wall motion, anterior tibialis EMG, snore microphone, electrocardiogram, and pulse oximetry.  MEDICATIONS Medications self-administered by patient taken the night of the study : ATORVASTATIN  SLEEP ARCHITECTURE The study was initiated at 10:35:08 PM and ended at 5:05:18 AM.  Sleep onset time was 151.5 minutes and the sleep efficiency was 39.0%%. The total sleep time was 152 minutes.  Stage REM latency was 76.5 minutes.  The patient spent 19.7%% of the night in stage N1 sleep, 61.8%% in stage N2 sleep, 0.0%% in stage N3 and 18.4% in REM.  Alpha intrusion was absent.  Supine sleep was 75.33%.  RESPIRATORY PARAMETERS The overall apnea/hypopnea index (AHI) was 69.5 per hour. There were 35 total apneas, including 26 obstructive, 7 central and 2 mixed apneas. There were 141 hypopneas and 3 RERAs.  The AHI during Stage REM sleep was 102.9 per hour.  AHI while supine was 82.8 per hour.  The mean oxygen saturation was 90.7%. The minimum SpO2 during sleep was 76.0%.  moderate snoring was noted during this study.  CARDIAC DATA The 2 lead EKG demonstrated sinus rhythm. The mean heart rate was  77.9 beats per minute. Other EKG findings include: PVCs.  LEG MOVEMENT DATA The total PLMS were 0 with a resulting PLMS index of 0.0. Associated arousal with leg movement index was 0.0 .  IMPRESSIONS - Severe obstructive sleep apnea occurred during this study (AHI = 69.5/h). - No significant central sleep apnea occurred during this study (CAI = 2.8/h). - Moderate oxygen desaturation was noted during this study (Min O2 = 76.0%). Mean sat 90.7%. - The patient snored with moderate snoring volume. - EKG findings include PVCs. - Clinically significant periodic limb movements did not occur during sleep. No significant associated arousals. - Patient had difficulty initiating sleep, reporting that he had slept on and off the day of the study.  DIAGNOSIS - Obstructive Sleep Apnea (327.23 [G47.33 ICD-10])  RECOMMENDATIONS - Suggest CPAP titration sleep study or autopap. Other options would be based on clinical judgment. - Be careful with alcohol, sedatives and other CNS depressants that may worsen sleep apnea and disrupt normal sleep architecture. - Sleep hygiene should be reviewed to assess factors that may improve sleep quality. - Weight management and regular exercise should be initiated or continued if appropriate.  [Electronically signed] 08/25/2019 12:21 PM  Jetty Duhamel MD, ABSM Diplomate, American Board of Sleep Medicine   NPI: [id number]                         Jetty Duhamel Diplomate, American Board of Sleep Medicine  ELECTRONICALLY SIGNED ON:  08/25/2019, 12:18 PM [facility] PH: [phone]   FX: [phone] ACCREDITED BY THE AMERICAN ACADEMY OF SLEEP MEDICINE

## 2020-02-28 ENCOUNTER — Other Ambulatory Visit: Payer: Self-pay | Admitting: Registered Nurse

## 2020-02-28 DIAGNOSIS — R1032 Left lower quadrant pain: Secondary | ICD-10-CM

## 2020-02-28 DIAGNOSIS — R109 Unspecified abdominal pain: Secondary | ICD-10-CM

## 2020-02-29 ENCOUNTER — Other Ambulatory Visit: Payer: Self-pay | Admitting: Registered Nurse

## 2020-02-29 ENCOUNTER — Inpatient Hospital Stay: Admission: RE | Admit: 2020-02-29 | Payer: Medicare Other | Source: Ambulatory Visit

## 2020-02-29 DIAGNOSIS — R1032 Left lower quadrant pain: Secondary | ICD-10-CM

## 2020-02-29 DIAGNOSIS — K5792 Diverticulitis of intestine, part unspecified, without perforation or abscess without bleeding: Secondary | ICD-10-CM

## 2020-03-03 ENCOUNTER — Ambulatory Visit
Admission: RE | Admit: 2020-03-03 | Discharge: 2020-03-03 | Disposition: A | Discharge status: Home / Self Care | Payer: Medicare Other | Source: Ambulatory Visit | Attending: Registered Nurse | Admitting: Registered Nurse

## 2020-03-03 ENCOUNTER — Other Ambulatory Visit: Payer: Self-pay

## 2020-03-03 DIAGNOSIS — R1032 Left lower quadrant pain: Secondary | ICD-10-CM

## 2020-03-03 DIAGNOSIS — K5792 Diverticulitis of intestine, part unspecified, without perforation or abscess without bleeding: Secondary | ICD-10-CM

## 2020-03-03 MED ORDER — IOPAMIDOL (ISOVUE-300) INJECTION 61%
100.0000 mL | Freq: Once | INTRAVENOUS | Status: AC | PRN
  Administered 2020-03-03: 100 mL via INTRAVENOUS

## 2020-08-23 ENCOUNTER — Other Ambulatory Visit: Payer: Self-pay

## 2020-08-23 ENCOUNTER — Ambulatory Visit: Payer: Medicare Other | Attending: General Practice

## 2020-08-23 DIAGNOSIS — G8929 Other chronic pain: Secondary | ICD-10-CM | POA: Insufficient documentation

## 2020-08-23 DIAGNOSIS — M5386 Other specified dorsopathies, lumbar region: Secondary | ICD-10-CM | POA: Diagnosis present

## 2020-08-23 DIAGNOSIS — M545 Low back pain, unspecified: Secondary | ICD-10-CM | POA: Insufficient documentation

## 2020-08-25 NOTE — Therapy (Addendum)
[place], [place], [address] Phone: [phone]   Fax:  [phone]  Physical Therapy Evaluation/Discharge  Patient Details  Name: Steven Jimenez MRN: [id number] Date of Birth: 11-Apr-1946 Referring Provider (PT): Steven Moro, DO   Encounter Date: 08/23/2020   PT End of Session - 08/25/20 0032    Visit Number 1    Number of Visits 13    Date for PT Re-Evaluation 10/18/20    Authorization Type MEDICARE PART A AND B    PT Start Time 1120    PT Stop Time 1220    PT Time Calculation (min) 60 min    Activity Tolerance Patient tolerated treatment well    Behavior During Therapy [facility] for tasks assessed/performed           Past Medical History:  Diagnosis Date  . Enlarged prostate     History reviewed. No pertinent surgical history.  There were no vitals filed for this visit.        [facility] PT Assessment - 08/25/20 0001      Assessment   Medical Diagnosis Low back pain and Dorsalgia, unspecified    Referring Provider (PT) Steven Moro, DO    Onset Date/Surgical Date --   years   Hand Dominance Right    Prior Therapy yes      Precautions   Precautions None      Restrictions   Weight Bearing Restrictions No      Balance Screen   Has the patient fallen in the past 6 months No      [place] residence    Living Arrangements Spouse/significant other    Imogene to enter    Entrance Stairs-Number of Steps 5    Entrance Stairs-Rails None    Home Layout Multi-level    Alternate Level Stairs-Number of Steps 7    Alternate Level Stairs-Rails Right      Prior Function   Level of Independence Independent    Vocation Retired      Associate Professor   Overall Cognitive Status History of cognitive impairments - at baseline   aphasia     Observation/Other Assessments   Focus on Therapeutic Outcomes (FOTO)  NA      Sensation   Light Touch Appears Intact       Posture/Postural Control   Posture/Postural Control Postural limitations    Postural Limitations Forward head;Rounded Shoulders      ROM / Strength   AROM / PROM / Strength AROM;Strength      AROM   AROM Assessment Site Lumbar    Lumbar Flexion 16 inches from floor. 50% limitation. Tight hamstrings   With forward flexion lumbar lordosis does not completely reduce   Lumbar Extension 25% limitation c pain    Lumbar - Right Side Bend 25% limitation c pain    Lumbar - Left Side Bend 25% limitation c pain    Lumbar - Right Rotation 25% limitation s pain    Lumbar - Left Rotation 25% limitation s pain      Strength   Overall Strength Comments Myotomal screen for LEs is negative      Flexibility   Soft Tissue Assessment /Muscle Length yes    Hamstrings Tight bilaterally      Palpation   Palpation comment TTP lumbar paraspinals      Transfers   Transfers Sit to Stand;Stand to Sit    Sit to Stand 7: Independent  Ambulation/Gait   Ambulation/Gait Yes    Gait Pattern Within Functional Limits;Step-through pattern                      Objective measurements completed on examination: See above findings.               PT Education - 08/25/20 0028    Education Details Eval findings, POC, HEP, use of heating pad for pain management    Person(s) Educated Patient;Child(ren)    Methods Explanation;Demonstration;Tactile cues;Verbal cues;Handout    Comprehension Verbalized understanding;Returned demonstration;Verbal cues required;Tactile cues required;Need further instruction            PT Short Term Goals - 08/25/20 0106      PT SHORT TERM GOAL #1   Title Pt/family member will be Ind in an initial HEP    Baseline started on eval    Status New    Target Date 09/15/20      PT SHORT TERM GOAL #2   Title Pt/family member will voice understanding of measures to assist in the reduction of pain.    Status New    Target Date 09/15/20              PT Long Term Goals - 08/25/20 0109      PT LONG TERM GOAL #1   Title Pt/CG will be Ind in a final HEP to maintain or progress ahieved level of function    Status New    Target Date 10/18/20      PT LONG TERM GOAL #2   Title Pt will demonstrate improved forward flexion AROM to a 25% limitation, reaching to wihtin 8 inches of the floor    Baseline 16 inch from the floor    Status New    Target Date 10/18/20      PT LONG TERM GOAL #3   Title Pt will report a decreased level of low back pain to 3/10 or less on an consistent basis    Status New    Target Date 10/18/20                  Plan - 08/25/20 0003    Clinical Impression Statement Pt presents to PT with a chronic history of low back. Pt daughter, Steven Jimenez, attended the visit with her father. Pt's trunk ROM is decrease globally c forward flexion being the most limited with tight hamstrings and lumbar lordosis not reversing. Pt was instructed in ther ex/HEP for lumbopelvic and hamstring flexibility and lumbopelvic strengthening to promote decompression and stability of the low back.  Pt's daughter Steven Jimenez inquired about moist heat, electric stim and massage, stating this was the course of PT he received when he lived in [place]. Steven Jimenez was advised modalities and massage can be helpful, however an active course of care addressing flexibility and strength would provide more long term benefits. Pt will benefit from skilled PT 2w6 for ther ex for ROM and strengthening, manual techniques and modalities to decrease pain and optimize function.    Personal Factors and Comorbidities Comorbidity 1;Time since onset of injury/illness/exacerbation    Comorbidities Aphasia    Examination-Activity Limitations Locomotion Level;Squat;Stairs;Stand;Sleep    Stability/Clinical Decision Making Evolving/Moderate complexity    Clinical Decision Making Moderate    Rehab Potential Good    PT Frequency 2x / week    PT Duration 6 weeks    PT  Treatment/Interventions ADLs/Self Care Home Management;Cryotherapy;Electrical Stimulation;Ultrasound;Traction;Moist Heat;Iontophoresis 24m/ml Dexamethasone;Therapeutic activities;Therapeutic exercise;Manual techniques;Patient/family education;Passive  range of motion;Dry needling;Taping;Vasopneumatic Device    PT Next Visit Plan Assess response to HEP, progress ther ex as indicated, provided manual techniques and modalities as indicated    PT Home Exercise Plan [email or web address]    Consulted and Agree with Plan of Care Patient           Patient will benefit from skilled therapeutic intervention in order to improve the following deficits and impairments:  Decreased activity tolerance,Pain,Impaired flexibility,Decreased strength,Postural dysfunction,Difficulty walking  Visit Diagnosis: Chronic bilateral low back pain without sciatica  Decreased ROM of lumbar spine     Problem List Patient Active Problem List   Diagnosis Date Noted  . Witnessed episode of apnea 08/21/2019  . Acute respiratory failure with hypoxia ([place]) 09/25/2018  . Multifocal pneumonia 09/25/2018  . Aphasia 09/25/2018    Gar Ponto MS, PT 08/25/20 1:21 AM   PHYSICAL THERAPY DISCHARGE SUMMARY  Visits from Start of Care: 1- Eval only  Current functional level related to goals / functional outcomes: Family member canceled remaining appts   Remaining deficits:  Family member canceled remaining appts  Education / Equipment: HEP Plan: Patient agrees to discharge.  Patient goals were not met. Patient is being discharged due to the patient's request.  ?????       [place], [place], [address] Phone: [phone]   Fax:  [phone]  Name: Steven Jimenez MRN: [id number] Date of Birth: 10/20/1945

## 2020-09-05 ENCOUNTER — Ambulatory Visit: Payer: Medicare Other | Admitting: Physical Therapy

## 2020-12-12 ENCOUNTER — Other Ambulatory Visit: Payer: Self-pay | Admitting: Family Medicine

## 2020-12-12 ENCOUNTER — Ambulatory Visit
Admission: RE | Admit: 2020-12-12 | Discharge: 2020-12-12 | Disposition: A | Discharge status: Home / Self Care | Payer: Medicare Other | Source: Ambulatory Visit | Attending: Family Medicine | Admitting: Family Medicine

## 2020-12-12 DIAGNOSIS — M542 Cervicalgia: Secondary | ICD-10-CM

## 2020-12-12 DIAGNOSIS — M5489 Other dorsalgia: Secondary | ICD-10-CM

## 2021-07-14 ENCOUNTER — Other Ambulatory Visit: Payer: Self-pay | Admitting: Family Medicine

## 2021-07-15 ENCOUNTER — Other Ambulatory Visit: Payer: Self-pay | Admitting: Family Medicine

## 2021-07-15 DIAGNOSIS — N50812 Left testicular pain: Secondary | ICD-10-CM

## 2021-07-16 ENCOUNTER — Other Ambulatory Visit: Payer: PRIVATE HEALTH INSURANCE

## 2021-07-16 ENCOUNTER — Ambulatory Visit
Admission: RE | Admit: 2021-07-16 | Discharge: 2021-07-16 | Disposition: A | Discharge status: Home / Self Care | Payer: Medicare Other | Source: Ambulatory Visit | Attending: Family Medicine | Admitting: Family Medicine

## 2021-07-16 DIAGNOSIS — N50812 Left testicular pain: Secondary | ICD-10-CM

## 2021-10-14 ENCOUNTER — Ambulatory Visit (INDEPENDENT_AMBULATORY_CARE_PROVIDER_SITE_OTHER): Payer: Medicare Other

## 2021-10-14 ENCOUNTER — Encounter: Payer: Self-pay | Admitting: Pulmonary Disease

## 2021-10-14 ENCOUNTER — Ambulatory Visit (INDEPENDENT_AMBULATORY_CARE_PROVIDER_SITE_OTHER): Payer: Medicare Other | Admitting: Pulmonary Disease

## 2021-10-14 VITALS — BP 130/80 | HR 88 | Temp 98.2°F | Ht 65.0 in | Wt 179.0 lb

## 2021-10-14 DIAGNOSIS — J9601 Acute respiratory failure with hypoxia: Secondary | ICD-10-CM | POA: Diagnosis not present

## 2021-10-14 DIAGNOSIS — R0681 Apnea, not elsewhere classified: Secondary | ICD-10-CM | POA: Diagnosis not present

## 2021-10-14 DIAGNOSIS — R0609 Other forms of dyspnea: Secondary | ICD-10-CM

## 2021-10-14 NOTE — Patient Instructions (Signed)
History of obstructive sleep apnea ? ?We will schedule you for a sleep study-split-night sleep study ? ?Schedule you for breathing study ? ?Regular exercises as tolerated ? ?Chest x-ray ? ?I will see you in about 6 to 8 weeks ? ?Call with significant concerns ?

## 2021-10-14 NOTE — Progress Notes (Signed)
? ?      ?Steven Jimenez    [id number]    July 31, 1945 ? ?Primary Care Physician:Patient, No Pcp Per (Inactive) ? ?Referring Physician: Ellyn Hack, MD ?[address] ?[place],  [place] [address] ? ?Chief complaint:   ?Shortness of breath while asleep, gasping for air, erratic breathing at night ? ?HPI: ? ?Being evaluated for possible sleep disordered breathing ?Had had a sleep study done in 2021 ?-Claims not to be aware of the result ?-Reviewing his record did reveal severe obstructive sleep apnea with AHI in the 60s ? ?Never really smoked ?No history of lung disease ? ?Usually goes to bed between 11 and 12 ?May take him about an hour to fall asleep sometimes ?About 2 awakenings ?Final wake up time between 7 and 7:30 AM ? ?Occasional dryness ?No morning headaches ?No night sweats ? ?States is quite active, does not exercise regularly ? ?History of hypertension, diabetes, prostatitis ?You can ? ?Outpatient Encounter Medications as of 10/14/2021  ?Medication Sig  ? atorvastatin (LIPITOR) 40 MG tablet Take 40 mg by mouth daily.  ? ibuprofen (ADVIL) 400 MG tablet Take by mouth.  ? metoprolol succinate (TOPROL-XL) 25 MG 24 hr tablet Take 25 mg by mouth daily.  ? [DISCONTINUED] omeprazole (PRILOSEC) 20 MG capsule omeprazole 20 mg capsule,delayed release (Patient not taking: Reported on 10/14/2021)  ? ?No facility-administered encounter medications on file as of 10/14/2021.  ? ? ?Allergies as of 10/14/2021  ? (No Known Allergies)  ? ? ?Past Medical History:  ?Diagnosis Date  ? Allergic rhinitis   ? Enlarged prostate   ? Hypertension   ? ? ?No past surgical history on file. ? ?Family History  ?Problem Relation Age of Onset  ? Hypertension Mother   ? Hypertension Father   ? ? ?Social History  ? ?Socioeconomic History  ? Marital status: Married  ?  Spouse name: Not on file  ? Number of children: Not on file  ? Years of education: Not on file  ? Highest education level: Not on file  ?Occupational History  ? Not on file   ?Tobacco Use  ? Smoking status: Never  ? Smokeless tobacco: Never  ?Substance and Sexual Activity  ? Alcohol use: Yes  ?  Comment: occ  ? Drug use: No  ? Sexual activity: Not on file  ?Other Topics Concern  ? Not on file  ?Social History Narrative  ? Not on file  ? ?Social Determinants of Health  ? ?Financial Resource Strain: Not on file  ?Food Insecurity: Not on file  ?Transportation Needs: Not on file  ?Physical Activity: Not on file  ?Stress: Not on file  ?Social Connections: Not on file  ?Intimate Partner Violence: Not on file  ? ? ?Review of Systems  ?Respiratory:  Positive for apnea.   ?Psychiatric/Behavioral:  Positive for sleep disturbance.   ? ?Vitals:  ? 10/14/21 1417  ?BP: 130/80  ?Pulse: 88  ?Temp: 98.2 ?F (36.8 ?C)  ?SpO2: 97%  ? ? ? ?Physical Exam ?Constitutional:   ?   Appearance: Normal appearance.  ?HENT:  ?   Head: Normocephalic.  ?   Mouth/Throat:  ?   Mouth: Mucous membranes are moist.  ?Cardiovascular:  ?   Rate and Rhythm: Normal rate and regular rhythm.  ?   Heart sounds: No murmur heard. ?  No friction rub.  ?Pulmonary:  ?   Effort: No respiratory distress.  ?   Breath sounds: No stridor. No wheezing or rhonchi.  ?Musculoskeletal:  ?  Cervical back: No rigidity or tenderness.  ?Neurological:  ?   Mental Status: He is alert.  ?Psychiatric:     ?   Mood and Affect: Mood normal.  ? ? ?  10/14/2021  ?  2:00 PM  ?Results of the Epworth flowsheet  ?Sitting and reading 2  ?Watching TV 2  ?Sitting, inactive in a public place (e.g. a theatre or a meeting) 2  ?As a passenger in a car for an hour without a break 1  ?Lying down to rest in the afternoon when circumstances permit 2  ?Sitting and talking to someone 0  ?Sitting quietly after a lunch without alcohol 0  ?In a car, while stopped for a few minutes in traffic 0  ?Total score 9  ? ?Data Reviewed: ?Sleep study 08/21/2019 did reveal severe obstructive sleep apnea with an AHI of 69.5 with oxygen nadir of 76% ? ?Assessment:  ?History of obstructive  sleep apnea ? ?Nonrestorative sleep ? ?Daytime sleepiness ? ?Untreated sleep disordered breathing may be contributing to daytime fatigue and sleepiness ? ?Importance of graded exercises was discussed with the patient ? ?Pathophysiology of sleep disordered breathing was discussed with the patient and his daughter was present, and other daughter was on the phone during the visit ? ?Plan/Recommendations: ?Schedule patient for a split-night study ? ?For shortness of breath during the day, we will get a pulmonary function study, have a chest x-ray performed today ? ?Does not have an underlying history of any lung problems, did not smoke in the past ? ?Importance of graded exercises ? ?Significance of sleep disordered breathing and the treatment options were discussed ? ?Tentative follow-up in about 6 to 8 weeks ? ?Encouraged to call with any significant concerns ? ? ?Virl Diamond MD ?[facility] ?10/14/2021, 2:46 PM ? ?CC: Ellyn Hack, MD ? ? ? ?

## 2021-11-01 ENCOUNTER — Ambulatory Visit (HOSPITAL_BASED_OUTPATIENT_CLINIC_OR_DEPARTMENT_OTHER): Payer: Medicare Other | Attending: Pulmonary Disease | Admitting: Pulmonary Disease

## 2021-11-01 ENCOUNTER — Other Ambulatory Visit: Payer: Self-pay

## 2021-11-01 DIAGNOSIS — J9601 Acute respiratory failure with hypoxia: Secondary | ICD-10-CM | POA: Diagnosis not present

## 2021-11-01 DIAGNOSIS — I10 Essential (primary) hypertension: Secondary | ICD-10-CM | POA: Diagnosis not present

## 2021-11-01 DIAGNOSIS — G4733 Obstructive sleep apnea (adult) (pediatric): Secondary | ICD-10-CM | POA: Diagnosis not present

## 2021-11-01 DIAGNOSIS — R0681 Apnea, not elsewhere classified: Secondary | ICD-10-CM | POA: Diagnosis present

## 2021-11-11 ENCOUNTER — Telehealth: Payer: Self-pay | Admitting: Pulmonary Disease

## 2021-11-11 DIAGNOSIS — R0681 Apnea, not elsewhere classified: Secondary | ICD-10-CM

## 2021-11-11 DIAGNOSIS — G4733 Obstructive sleep apnea (adult) (pediatric): Secondary | ICD-10-CM

## 2021-11-11 NOTE — Procedures (Signed)
POLYSOMNOGRAPHY ? ?Last, First: Steven Jimenez, Steven Jimenez ?MRN: [id number] ?Gender: Male ?Age (years): 76 ?Weight (lbs): 175 ?DOB: 09-30-1945 ?BMI: 29 ?Primary Care: No PCP ?Epworth Score: <br> ?Referring: Tomma Lightning MD ?Technician: Rosette Reveal ?Interpreting: Tomma Lightning MD ?Study Type: NPSG ?Ordered Study Type: Split Night CPAP ?Study date: 11/01/2021 ?Location: [place] ?CLINICAL INFORMATION ?Steven Jimenez is a 76 year old Male and was referred to the sleep center for evaluation of G47.33 OSA: Adult and Pediatric (327.23). Indications include Hypertension, Snoring. ? ? ?Most recent polysomnogram dated 08/21/2019 revealed an AHI of 69.5/h and RDI of 70.7/h. ?MEDICATIONS ?Patient self administered medications include: ATORVASTATIN. Medications administered during study include No sleep medicine administered. ? ?SLEEP STUDY TECHNIQUE ?A multi-channel overnight Polysomnography study was performed. The channels recorded and monitored were central and occipital EEG, electrooculogram (EOG), submentalis EMG (chin), nasal and oral airflow, thoracic and abdominal wall motion, anterior tibialis EMG, snore microphone, electrocardiogram, and a pulse oximetry. ?TECHNICIAN COMMENTS ?Comments added by Technician: Patient was restless all through the night. ?Comments added by Scorer: N/A ?SLEEP ARCHITECTURE ?The study was initiated at 11:02:25 PM and terminated at 5:03:27 AM. The total recorded time was 361 minutes. EEG confirmed total sleep time was 165 minutes yielding a sleep efficiency of 45.7%%. Sleep onset after lights out was 23.8 minutes with a REM latency of 112.5 minutes. The patient spent 19.4%% of the night in stage N1 sleep, 64.5%% in stage N2 sleep, 0.0%% in stage N3 and 16.1% in REM. Wake after sleep onset (WASO) was 172.2 minutes. The Arousal Index was 8.4/hour. ?RESPIRATORY PARAMETERS ?There were a total of 40 respiratory disturbances out of which 7 were apneas ( 7 obstructive, 0 mixed, 0 central) and 33  hypopneas. The apnea/hypopnea index (AHI) was 14.5 events/hour. The central sleep apnea index was 0 events/hour. The REM AHI was 45.3 events/hour and NREM AHI was 8.7 events/hour. The supine AHI was 64.0 events/hour and the non supine AHI was 9.6 supine during 9.10% of sleep. Respiratory disturbances were associated with oxygen desaturation down to a nadir of 77.0% during sleep. The mean oxygen saturation during the study was 92.2%. The cumulative time under 88% oxygen saturation was 5.5 minutes. ? ?LEG MOVEMENT DATA ?The total leg movements were 0 with a resulting leg movement index of 0.0/hr .Associated arousal with leg movement index was 0.0/hr. ? ?CARDIAC DATA ?The underlying cardiac rhythm was most consistent with sinus rhythm. Mean heart rate during sleep was 78.8 bpm. Additional rhythm abnormalities include None. ?IMPRESSIONS ?- Mild Obstructive Sleep apnea(OSA) ?- EKG showed no cardiac abnormalities. ?- The patient snored with soft snoring volume. ?- No significant periodic leg movements(PLMs) during sleep. However, no significant associated arousals. ? ?DIAGNOSIS ?- Mild Obstructive Sleep Apnea (G47.33) ? ?RECOMMENDATIONS ?- Therapeutic CPAP titration to determine optimal pressure required to alleviate sleep disordered breathing. ?- Auto CPAP for management of mild obstructive sleep apnea may be considered. ?- An oral device may be considered as an option of treatment for mild obstructive sleep apnea, will require referral to a dentist. ?- Positional therapy avoiding supine position during sleep. ?- Avoid alcohol, sedatives and other CNS depressants that may worsen sleep apnea and disrupt normal sleep architecture. ?- Sleep hygiene should be reviewed to assess factors that may improve sleep quality. ?- Weight management and regular exercise should be initiated or continued. ? ?[Electronically signed] 11/11/2021 07:42 AM ? ?Virl Diamond MD ?NPI: [id number] ? ?

## 2021-11-11 NOTE — Telephone Encounter (Signed)
I called and was unable to leave a message for the results.  ?

## 2021-11-11 NOTE — Telephone Encounter (Signed)
Call patient ? ?Sleep study result ? ?Date of study: ?11/01/2021 ? ?Impression: ?Mild obstructive sleep apnea ? ?Recommendation: ?Options of treatment for mild obstructive sleep apnea will include ? ?1.  CPAP therapy if there is significant daytime sleepiness or other comorbidities including history of CVA or cardiac disease ? ?-If CPAP is chosen as an option of treatment auto titrating CPAP with a pressure setting of 5-15 will be appropriate ? ?2.  Watchful waiting with emphasis on weight loss measures, sleep position modification to optimize lateral sleep, elevating the head of the bed by about 30 degrees may also help. ? ?3.  An oral device may be fashioned for the treatment of mild sleep disordered breathing, will involve referral to dentist. ? ? ?Follow-up as previously scheduled ?

## 2021-11-16 NOTE — Telephone Encounter (Signed)
I attempted to leave a message to call back for results and I was not able to leave a message.  ?

## 2021-11-19 ENCOUNTER — Encounter (HOSPITAL_BASED_OUTPATIENT_CLINIC_OR_DEPARTMENT_OTHER): Payer: Medicare Other | Admitting: Pulmonary Disease

## 2021-11-19 NOTE — Telephone Encounter (Signed)
Please call to make a follow up to go over his sleep study.  

## 2021-11-20 NOTE — Telephone Encounter (Signed)
I called and spoke with the daughter and she voices understanding. Nothing further needed.

## 2021-11-20 NOTE — Telephone Encounter (Signed)
Spoke with patient's daughter, scheduled 6-8 week follow up as AO recommended in last office visit note. Patient's daughter states she would like to call to discuss sleep study results- it is hard for the patient to come in for appointment due to someone having to bring him.   Please call patient to discuss sleep study.

## 2021-12-23 ENCOUNTER — Telehealth: Payer: Self-pay

## 2021-12-23 NOTE — Telephone Encounter (Signed)
Patient had referral for sleep consult - referral denied due to patient having a sleep study in Feb. 2021 - pt should return to sleep provider that was previously seen for any sleep concerns or follow up care. Referring office notified

## 2021-12-29 ENCOUNTER — Ambulatory Visit: Payer: Medicare Other | Admitting: Pulmonary Disease

## 2022-02-18 ENCOUNTER — Other Ambulatory Visit: Payer: Self-pay

## 2022-02-18 ENCOUNTER — Emergency Department (HOSPITAL_COMMUNITY)
Admission: EM | Admit: 2022-02-18 | Discharge: 2022-02-18 | Disposition: A | Discharge status: Home / Self Care | Payer: Medicare Other | Attending: Emergency Medicine | Admitting: Emergency Medicine

## 2022-02-18 DIAGNOSIS — H5711 Ocular pain, right eye: Secondary | ICD-10-CM | POA: Diagnosis present

## 2022-02-18 DIAGNOSIS — H1131 Conjunctival hemorrhage, right eye: Secondary | ICD-10-CM | POA: Insufficient documentation

## 2022-02-18 DIAGNOSIS — Z79899 Other long term (current) drug therapy: Secondary | ICD-10-CM | POA: Insufficient documentation

## 2022-02-18 MED ORDER — NAPHAZOLINE-GLYCERIN 0.012-0.25 % OP SOLN
1.0000 [drp] | Freq: Four times a day (QID) | OPHTHALMIC | Status: DC | PRN
  Administered 2022-02-18: 2 [drp] via OPHTHALMIC
  Filled 2022-02-18: qty 15

## 2022-02-18 NOTE — Discharge Instructions (Addendum)
You were evaluated in the emergency department for eye irritation and redness.  I think your eye redness is due to a collection of blood on the eye, which is similar to a bruise elsewhere on the body.  This may have been caused by rubbing or scratching the eye.  You may use lubricating eyedrops to help soothe eye irritation.  I would recommend follow-up with an eye doctor for closer evaluation of the eye in order to determine the cause of your frequent eye irritation.  Please return to the emergency department for signs or symptoms of a serious eye problem including, but not limited to: New or worsening eye pain, severe pain on moving the eye, persistent blurry or darkened vision, fevers greater than 100.4 F.

## 2022-02-18 NOTE — ED Provider Triage Note (Signed)
Emergency Medicine Provider Triage Evaluation Note  Steven Jimenez , a 76 y.o. male  was evaluated in triage.  Pt complains of eye redness off and on for the past 6 months since his cataract surgery. Denies any pain or vision changes. Reports it itches and stings. Denies any new trauma.   Review of Systems  Positive:  Negative:   Physical Exam  BP (!) 142/80   Pulse 63   Temp 98.2 F (36.8 C)   Resp 16   SpO2 98%  Gen:   Awake, no distress   Resp:  Normal effort  MSK:   Moves extremities without difficulty  Other:  Redness seen to right eye.   Medical Decision Making  Medically screening exam initiated at 7:01 PM.  Appropriate orders placed.  Godfrey Tritschler was informed that the remainder of the evaluation will be completed by another provider, this initial triage assessment does not replace that evaluation, and the importance of remaining in the ED until their evaluation is complete.  Will need Woods lamp exam with tonopen   Achille Rich, PA-C 02/18/22 [id number]

## 2022-02-18 NOTE — ED Triage Notes (Addendum)
Pt c/o "redness" in R eye. Denies HA, vision changes, states "it doesn't hurt but every now & again it stings/itches." Endorses recent cataract sx

## 2022-02-18 NOTE — ED Provider Notes (Signed)
[facility] EMERGENCY DEPARTMENT Provider Note   CSN: [id number] Arrival date & time: 02/18/22  1743     History  Chief Complaint  Patient presents with   Eye Pain    Steven Jimenez is a 76 y.o. male with history of subconjunctival hemorrhage who presents to the ED for eye irritation and redness.  His irritation is described as "stinging".  All also experiences itchiness.  2 days ago he noticed it was very red.  This has happened in the past a couple of times since his cataract surgery 6 months ago.  He uses lubricating eyedrops that helped somewhat.  His vision does not seem to be affected right now, although sometimes he experiences blurred vision in the affected eye.  Denies fever.  Denies pain on moving the eye.  Denies headache.  Denies trauma to the eye.   Eye Pain     Home Medications Prior to Admission medications   Medication Sig Start Date End Date Taking? Authorizing Provider  atorvastatin (LIPITOR) 40 MG tablet Take 40 mg by mouth daily. 08/04/21   [provider]  ibuprofen (ADVIL) 400 MG tablet Take by mouth. 09/21/21   [provider]  metoprolol succinate (TOPROL-XL) 25 MG 24 hr tablet Take 25 mg by mouth daily. 08/04/21   [provider]      Allergies    Patient has no known allergies.    Review of Systems   Review of Systems  Eyes:  Positive for pain.  All other systems reviewed and are negative.   Physical Exam Updated Vital Signs BP (!) 142/80   Pulse 63   Temp 98.2 F (36.8 C)   Resp 16   SpO2 98%  Physical Exam Vitals and nursing note reviewed.  Constitutional:      General: He is not in acute distress.    Appearance: He is well-developed.  HENT:     Head: Normocephalic and atraumatic.  Eyes:     General: Lids are normal. Vision grossly intact.     Conjunctiva/sclera:     Right eye: Hemorrhage present.   Cardiovascular:     Rate and Rhythm: Normal rate and regular rhythm.     Heart sounds: No  murmur heard. Pulmonary:     Effort: Pulmonary effort is normal. No respiratory distress.     Breath sounds: Normal breath sounds.  Abdominal:     Palpations: Abdomen is soft.     Tenderness: There is no abdominal tenderness.  Musculoskeletal:        General: No swelling.     Cervical back: Neck supple.  Skin:    General: Skin is warm and dry.     Capillary Refill: Capillary refill takes less than 2 seconds.  Neurological:     Mental Status: He is alert.  Psychiatric:        Mood and Affect: Mood normal.     ED Results / Procedures / Treatments   Labs (all labs ordered are listed, but only abnormal results are displayed) Labs Reviewed - No data to display  EKG None  Radiology No results found.  Procedures Procedures    Medications Ordered in ED Medications  naphazoline-glycerin (CLEAR EYES REDNESS) ophth solution 1-2 drop (has no administration in time range)    ED Course/ Medical Decision Making/ A&P                           Medical Decision Making  Steven Jimenez is a 76 year old male who presents with an itchy, irritated right eye and subconjunctival hemorrhage.  Vision is not affected.  The eye is not painful, nor does he have pain with eye movement.  No trauma history outside of cataract surgery approximately 6 months ago. Exam notable for collection of red blood over the sclera of medial aspect of right eye.  Extraocular movements intact.  No gross visual deficits.  Suspect subconjunctival hemorrhage secondary to eye irritation.  Do not suspect glaucoma or sight-threatening condition. Will offer lubricating eyedrops.  Encourage follow-up in outpatient setting for recurrent eye irritation and subconjunctival hemorrhage.  Additional history obtained:  Additional history and/or information obtained from daughter External records from outside source obtained and reviewed including charts from recent ED visit to [facility] ED for subconjunctival hemorrhage.  Medicines  ordered and prescription drug management:  I ordered medication including Visine. Reevaluation of the patient after these medicines showed that the patient stayed the same  Reevaluation:  After the interventions noted above, I reevaluated the patient and found that they have :stayed the same.    Dispostion:  After consideration of the diagnostic results and the patients response to treatment, I feel that the patent would benefit from discharge home with lubricating eye drops and outpatient follow up.          Final Clinical Impression(s) / ED Diagnoses Final diagnoses:  Subconjunctival hemorrhage of right eye    Rx / DC Orders ED Discharge Orders     None         Marrianne Mood, MD 02/18/22 [id number]    Gwyneth Sprout, MD 02/19/22 [phone]

## 2022-03-12 ENCOUNTER — Other Ambulatory Visit (HOSPITAL_BASED_OUTPATIENT_CLINIC_OR_DEPARTMENT_OTHER): Payer: Self-pay

## 2022-03-12 DIAGNOSIS — G4733 Obstructive sleep apnea (adult) (pediatric): Secondary | ICD-10-CM

## 2022-03-12 DIAGNOSIS — R0683 Snoring: Secondary | ICD-10-CM

## 2022-03-12 DIAGNOSIS — I10 Essential (primary) hypertension: Secondary | ICD-10-CM

## 2022-04-23 ENCOUNTER — Encounter (HOSPITAL_BASED_OUTPATIENT_CLINIC_OR_DEPARTMENT_OTHER): Payer: Medicare Other | Admitting: Internal Medicine

## 2022-09-12 IMAGING — US US SCROTUM W/ DOPPLER COMPLETE
1 series · 13 of 25 positions shown · non-contrast
Comparison: None.

CLINICAL DATA: Left testicular pain

EXAM:
SCROTAL ULTRASOUND
DOPPLER ULTRASOUND OF THE TESTICLES
TECHNIQUE: Complete ultrasound examination of the testicles, epididymis, and
other scrotal structures was performed. Color and spectral Doppler
ultrasound were also utilized to evaluate blood flow to the
testicles.

[Series 1: us scrotum w/ doppler complete · 0.06mm/px · 13 of 130 slices shown]
[im 1/130]
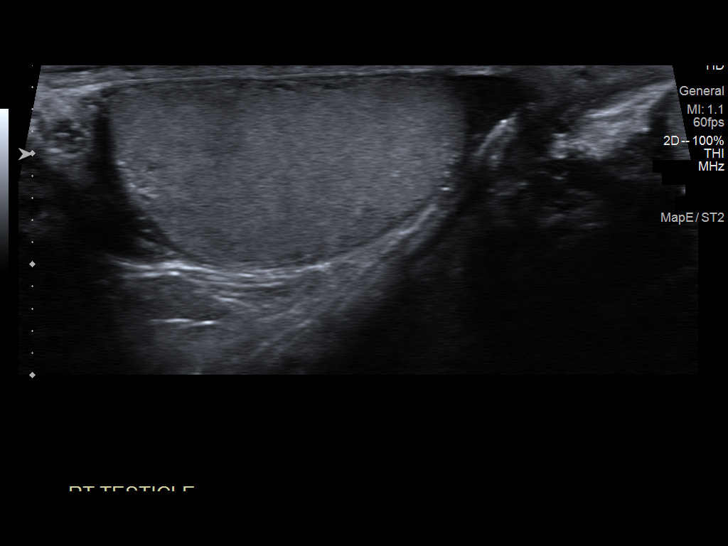
[im 11/130]
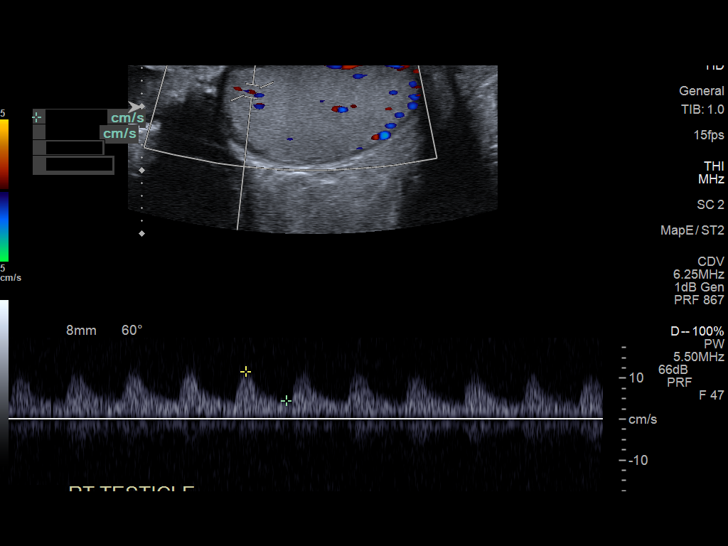
[im 22/130]
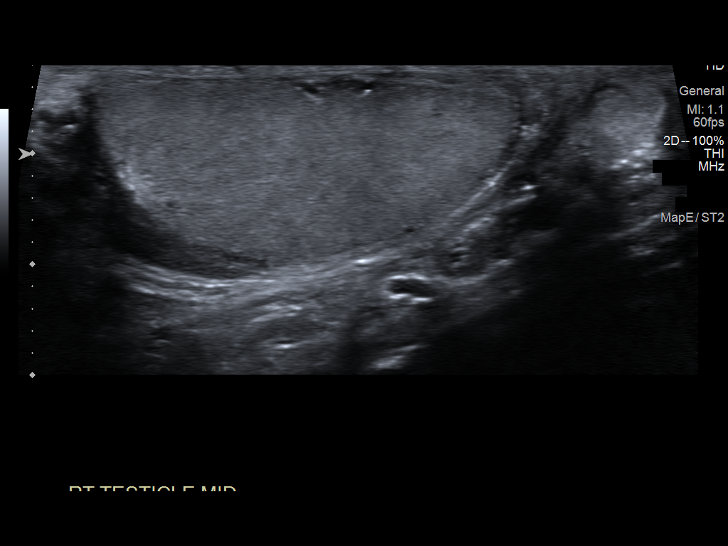
[im 33/130]
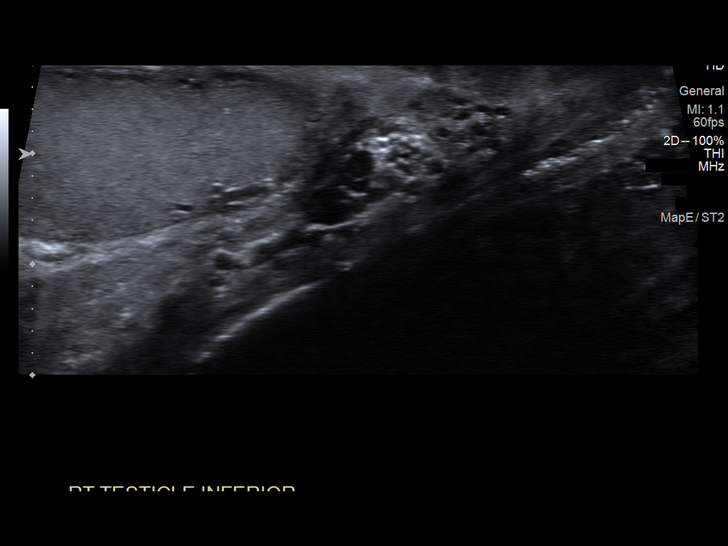
[im 44/130]
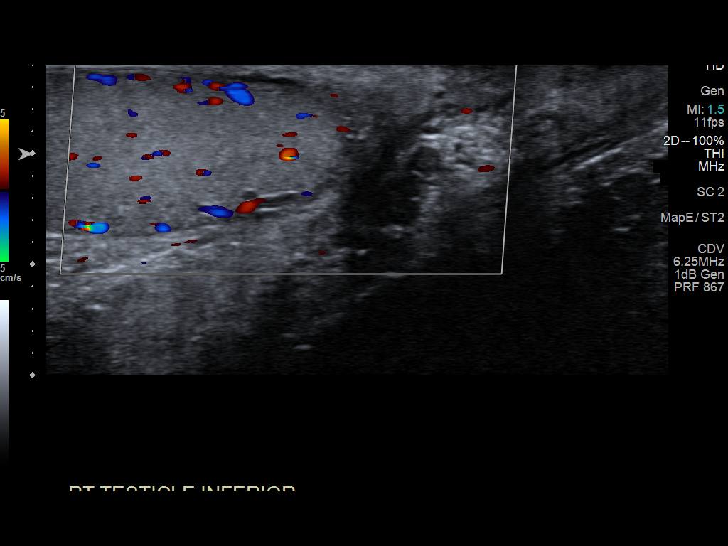
[im 54/130]
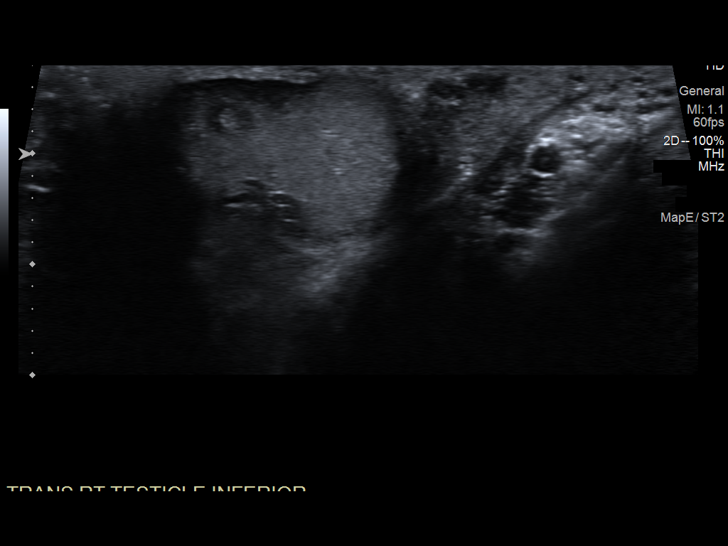
[im 65/130]
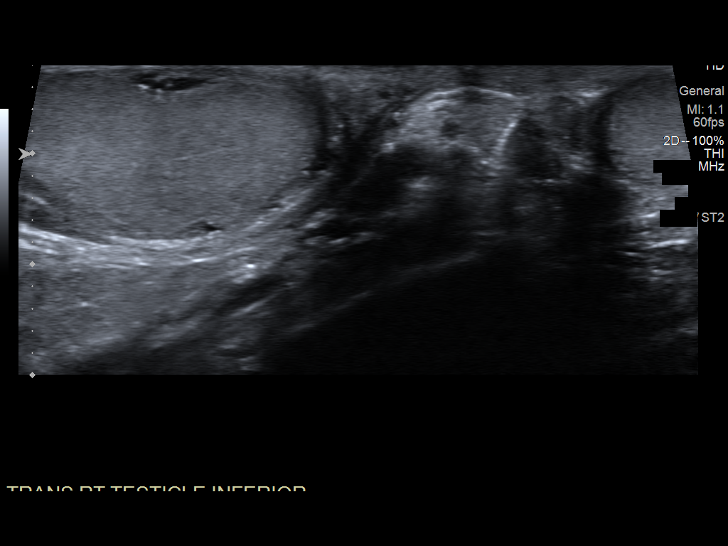
[im 76/130]
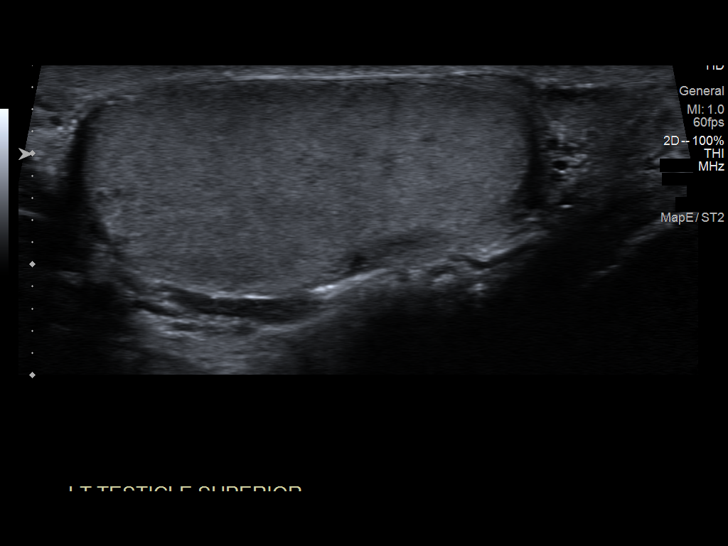
[im 87/130]
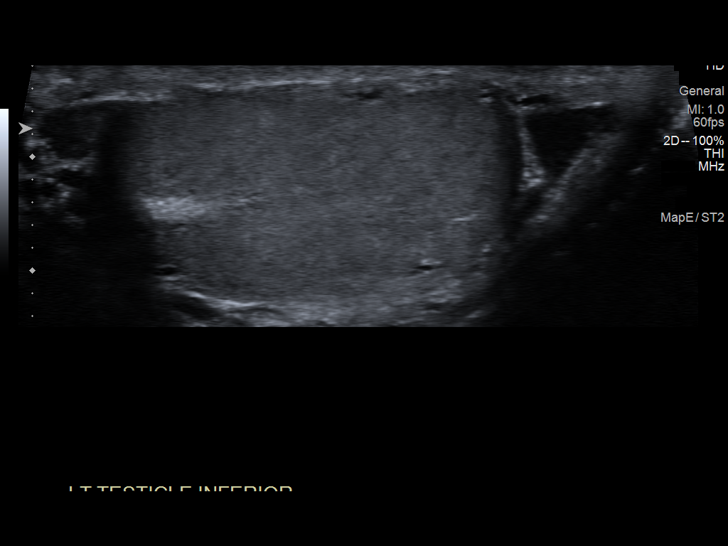
[im 97/130]
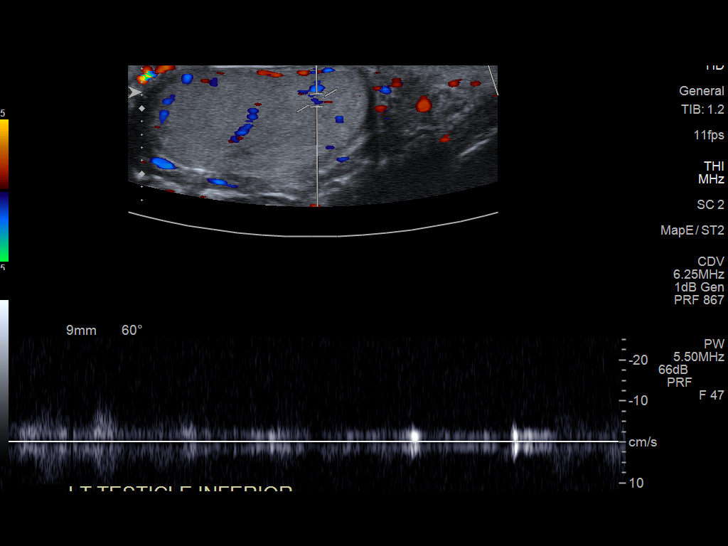
[im 108/130]
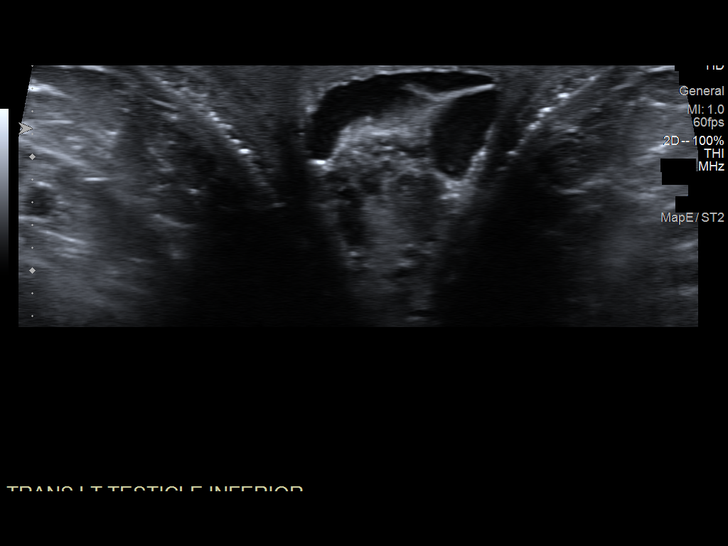
[im 119/130]
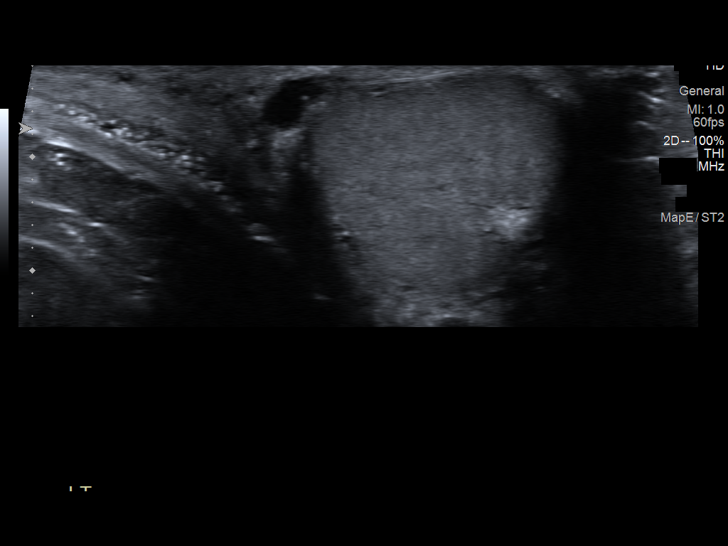
[im 130/130]
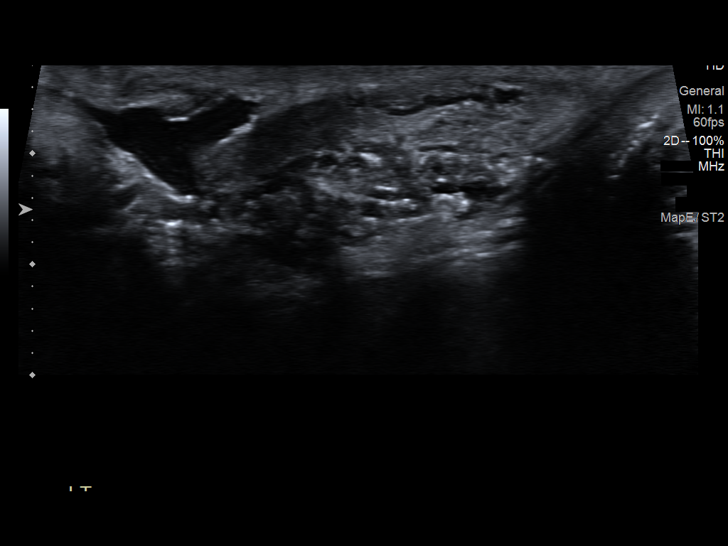

[13 of 25 positions shown; findings below may reference images not displayed]

FINDINGS: Right testicle

Measurements: 3.9 x 1.9 x 3.7 cm. There is generalized heterogeneity
of the testicle, with more focal area of heterogeneity along the
inferior aspect of the testicle with focal bulging. There is an
adjacent tortuous appearing vessel near the inferior aspect of the
right testicle, unclear if on the surface or within the testicle
based on the provided images.

Left testicle

Measurements: 4.4 x 1.9 x 2.8 cm. There is generalized heterogeneity
of the testicle.

Right epididymis:  Visualized portions are unremarkable.

Left epididymis: Tubular cystic structure within the left epididymis
likely a spermatocele.

Hydrocele:  Small left hydrocele.

Varicocele:  None visualized.

Pulsed Doppler interrogation of both testes demonstrates normal low
resistance arterial and venous waveforms bilaterally.
IMPRESSION: Generalized heterogeneity of the testicles, likely due to senescent
change or prior infection/inflammation. There is a more focal area
of heterogeneity in the right inferior testicle with focal bulging
and adjacent tortuous vessel but no definite focal mass. Correlate
with physical exam and recommend follow-up ultrasound in 3-6 months.

Left epididymal cyst/spermatocele.  Small left hydrocele.

No evidence of epididymoorchitis or torsion.

## 2023-09-29 ENCOUNTER — Ambulatory Visit (INDEPENDENT_AMBULATORY_CARE_PROVIDER_SITE_OTHER): Payer: Medicare Other | Admitting: Audiology

## 2023-09-29 ENCOUNTER — Ambulatory Visit (INDEPENDENT_AMBULATORY_CARE_PROVIDER_SITE_OTHER): Payer: Medicare Other | Admitting: Otolaryngology

## 2023-09-29 ENCOUNTER — Encounter (INDEPENDENT_AMBULATORY_CARE_PROVIDER_SITE_OTHER): Payer: Self-pay

## 2023-09-29 VITALS — BP 155/76 | HR 80 | Ht 65.0 in | Wt 172.0 lb

## 2023-09-29 DIAGNOSIS — H903 Sensorineural hearing loss, bilateral: Secondary | ICD-10-CM | POA: Diagnosis not present

## 2023-09-29 NOTE — Progress Notes (Signed)
  [address] [place], [place] [address] [phone]  Audiological Evaluation    Name: Steven Jimenez     DOB:   05-14-1946      MRN:   [id number]                                                                                     Service Date: 09/29/2023     Accompanied by: daughter   Patient comes today after Dr. Suszanne Conners, ENT sent a referral for a hearing evaluation due to concerns with hearing loss.   Symptoms Yes Details  Hearing loss  [x]  Feels that his hearing had gradually become worse  Tinnitus  [x]  Experience ringing in ears intermittently,not bothersome  Ear pain/ infections/pressure  []    Balance problems  [x]  Dizziness upon standing   Noise exposure history  [x]  Occupational noise exposure   Previous ear surgeries  []    Family history of hearing loss  []    Amplification  []    Other  []      Otoscopy: Right ear: Clear external ear canals and notable landmarks visualized on the tympanic membrane. Left ear:  Clear external ear canals and notable landmarks visualized on the tympanic membrane.  Tympanometry: Right ear: Type A- Normal external ear canal volume with normal middle ear pressure and tympanic membrane compliance Left ear: Type A- Normal external ear canal volume with normal middle ear pressure and tympanic membrane compliance  Pure tone Audiometry: Both ears- Normal hearing from [phone] Hz, then moderate to severe sensorineural hearing loss from 2000 Hz - 8000 Hz.   Speech Audiometry: Right ear- Speech Reception Threshold (SRT) was obtained at 25 dBHL. Left ear-Speech Reception Threshold (SRT) was obtained at 25 dBHL.   Word Recognition Score Tested using NU-6 (MLV) Right ear: 76% was obtained at a presentation level of 80 dBHL with contralateral masking which is deemed as  fair. Left ear: 68% was obtained at a presentation level of 80 dBHL with contralateral masking which is deemed as  poor.   The hearing test results were completed under  headphones and results are deemed to be of good to fair reliability. Test technique:  conventional    Impression: There is not a significant difference in pure-tone thresholds between ears., There is not a significant difference in the word recognition score in between ears.    Recommendations: Follow up with ENT as scheduled for today. Return for a hearing evaluation if concerns with hearing changes arise or per MD recommendation. Consider a communication needs assessment after medical clearance for hearing aids is obtained.   Steven Jimenez MARIE LEROUX-MARTINEZ, AUD

## 2023-09-30 DIAGNOSIS — H903 Sensorineural hearing loss, bilateral: Secondary | ICD-10-CM | POA: Insufficient documentation

## 2023-09-30 NOTE — Progress Notes (Signed)
 Patient ID: Steven Jimenez, male   DOB: September 21, 1945, 78 y.o.   MRN: [id number]  CC: Progressive hearing loss  HPI:  Steven Jimenez is a 78 y.o. male who presents today complaining of bilateral progressive hearing loss for several years.  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  He denies any otalgia, otorrhea, or vertigo.  The patient is having significant hearing difficulty, especially in noisy environments.  He has no recent otitis media or otitis externa.  He has no previous otologic surgery.  Past Medical History:  Diagnosis Date   Allergic rhinitis    Enlarged prostate    Hypertension     History reviewed. No pertinent surgical history.  Family History  Problem Relation Age of Onset   Hypertension Mother    Hypertension Father     Social History:  reports that he has never smoked. He has never used smokeless tobacco. He reports current alcohol use. He reports that he does not use drugs.  Allergies: No Known Allergies  Prior to Admission medications   Medication Sig Start Date End Date Taking? Authorizing Provider  metoprolol succinate (TOPROL-XL) 25 MG 24 hr tablet Take 25 mg by mouth daily. 08/04/21  Yes [provider]  rosuvastatin (CRESTOR) 20 MG tablet Take 20 mg by mouth at bedtime. 07/08/23  Yes [provider]  atorvastatin (LIPITOR) 40 MG tablet Take 40 mg by mouth daily. Patient not taking: Reported on 09/29/2023 08/04/21   [provider]  ibuprofen (ADVIL) 400 MG tablet Take by mouth. Patient not taking: Reported on 09/29/2023 09/21/21   [provider]    Blood pressure (!) 155/76, pulse 80, height 5\' 5"  (1.651 m), weight 172 lb (78 kg), SpO2 95%. Exam: General: Communicates without difficulty, well nourished, no acute distress. Head: Normocephalic, no evidence injury, no tenderness, facial buttresses intact without stepoff. Face/sinus: No tenderness to palpation and  percussion. Facial movement is normal and symmetric. Eyes: PERRL, EOMI. No scleral icterus, conjunctivae clear. Neuro: CN II exam reveals vision grossly intact.  No nystagmus at any point of gaze. Ears: Auricles well formed without lesions.  Ear canals are intact without mass or lesion.  No erythema or edema is appreciated.  The TMs are intact without fluid. Nose: External evaluation reveals normal support and skin without lesions.  Dorsum is intact.  Anterior rhinoscopy reveals normal mucosa over anterior aspect of inferior turbinates and intact septum.  No purulence noted. Oral:  Oral cavity and oropharynx are intact, symmetric, without erythema or edema.  Mucosa is moist without lesions. Neck: Full range of motion without pain.  There is no significant lymphadenopathy.  No masses palpable.  Thyroid bed within normal limits to palpation.  Parotid glands and submandibular glands equal bilaterally without mass.  Trachea is midline. Neuro:  CN 2-12 grossly intact.   His hearing test shows bilateral symmetric high-frequency sensorineural hearing loss.  Assessment: 1.  Bilateral symmetric high-frequency sensorineural hearing loss, consistent with presbycusis. 2.  His ear canals, tympanic membranes, and middle ear spaces are all normal.  No middle ear effusion or infection is noted.  Plan: 1.  The physical exam findings are reviewed with the patient. 2.  The patient is a candidate for hearing amplification.  Their hearing aid options are discussed. 3.  The patient will return for reevaluation in 1 year, sooner if needed.  Alanna Storti W Elbie Statzer 09/30/2023, 8:01 AM

## 2023-11-18 ENCOUNTER — Other Ambulatory Visit: Payer: Self-pay | Admitting: Family Medicine

## 2023-11-18 DIAGNOSIS — R1310 Dysphagia, unspecified: Secondary | ICD-10-CM

## 2023-12-01 ENCOUNTER — Ambulatory Visit
Admission: RE | Admit: 2023-12-01 | Discharge: 2023-12-01 | Disposition: A | Discharge status: Home / Self Care | Source: Ambulatory Visit | Attending: Family Medicine | Admitting: Family Medicine

## 2023-12-01 DIAGNOSIS — R1310 Dysphagia, unspecified: Secondary | ICD-10-CM

## 2023-12-06 ENCOUNTER — Other Ambulatory Visit: Payer: Self-pay | Admitting: Family Medicine

## 2023-12-06 DIAGNOSIS — K7469 Other cirrhosis of liver: Secondary | ICD-10-CM

## 2023-12-27 NOTE — Progress Notes (Unsigned)
 [facility] Initial Consultation   Referring Provider Health, [facility] [address] [place],  [place] [address]  Primary Care Provider Patient, No Pcp Per  Patient Profile: Laurier Jasperson is a 78 y.o. male who is seen in consultation in the [facility] at the request of Dr. Salome for evaluation and management of the problem(s) noted below.  Problem List: Dysphagia -barium esophagram with mild dysmotility History of diverticulitis 2021 Family history of colorectal cancer in a first-degree relative-daughter diagnosed age 75 Possible chronic liver disease/cirrhosis  History of Present Illness   Mr. Lipsey is a 78 y.o. male with a history of multifocal pneumonia, acute respiratory failure, aphasia, bilateral sensorineural hearing loss, HTN who presents to the office for evaluation and management of dysphagia, family history of colorectal cancer in a first-degree relative and recent concern for liver disease/cirrhosis  Dysphagia -- 1 month history of dysphagia to solids and liquids -- States that he has difficulty breathing when he swallows and feels material getting stuck -- Denies odynophagia -- No regurgitation or frank food impaction -- Denies a history of chronic acid reflux -- No history of neck surgeries -- No thyroid disease or palpable neck lesions -- No history of asthma, allergies -- Prior history of alcohol consumption but none current -- No tobacco use  -- Has been having problems with the teeth on the right side of his mouth that are painful which makes it difficult to chew -- Seeing dentist next week for further evaluation  -- Barium esophagram showed mild esophageal dysmotility  Family history of colorectal cancer in a first-degree relative-daughter diagnosed age 58 -- Reports having a colonoscopy many years ago-does not think there were any pertinent findings -- In recent past one of his daughters was diagnosed with colorectal cancer  at age 70 -- Denies change in bowel habits or rectal bleeding -- Amenable to proceeding with colonoscopy  Possible chronic liver disease/cirrhosis -- Noted that PCP recently ordered an ultrasound for evaluation of possible cirrhosis -- Daughter states that they may have been told he had elevated liver enzymes recently -- He is not sure if he may have had a history of hepatitis in the past -- Not aware of any chronic liver diseases personally or in the family -- Consumed larger quantities of alcohol in his younger years but has not consumed alcohol for 50 years -- Denies blood transfusion -- Abdominal ultrasound is scheduled at a future date  Last colonoscopy: Reports colonoscopy many years ago-results not availabale Last endoscopy: None  Last Abd CT/CTE/MRE: None  GI Review of Symptoms Significant for dysphagia. Otherwise negative.  General Review of Systems  Review of systems is significant for the pertinent positives and negatives as listed per the HPI.  Full ROS is otherwise negative.  Past Medical History   Past Medical History:  Diagnosis Date   Allergic rhinitis    Enlarged prostate    HLD (hyperlipidemia)    Hypertension    Pneumonia      Past Surgical History   Past Surgical History:  Procedure Laterality Date   PROSTATE SURGERY       Allergies and Medications   No Known Allergies   Current Meds  Medication Sig   amoxicillin (AMOXIL) 500 MG tablet Take 500 mg by mouth 3 (three) times daily.   lisinopril (ZESTRIL) 5 MG tablet Take 5 mg by mouth daily.   Na Sulfate-K Sulfate-Mg Sulfate concentrate (SUPREP) 17.5-3.13-1.6 GM/177ML SOLN Take 1 kit (354 mLs total) by mouth once for 1  dose.     Family History   Family History  Problem Relation Age of Onset   Hypertension Mother    Diabetes Mother    Hypertension Father    Cancer Daughter        type unknown     Social History   Social History   Tobacco Use   Smoking status: Never   Smokeless  tobacco: Never  Vaping Use   Vaping status: Never Used  Substance Use Topics   Alcohol use: Yes    Comment: occ   Drug use: No   Koston reports that he has never smoked. He has never used smokeless tobacco. He reports current alcohol use. He reports that he does not use drugs.  Vital Signs and Physical Examination   Vitals:   12/28/23 0826  BP: (!) 180/100  Pulse: 76   Body mass index is 30.11 kg/m. Weight: 170 lb (77.1 kg)  General: Well developed, well nourished, no acute distress Head: Normocephalic and atraumatic Eyes: Sclerae anicteric, EOMI Lungs: Clear throughout to auscultation Heart: Regular rate and rhythm; No murmurs, rubs or bruits Abdomen: Soft, non tender and non distended. No masses, hepatosplenomegaly or hernias noted. Normal Bowel sounds Rectal: Deferred Musculoskeletal: Symmetrical with no gross deformities    Review of Data  The following data was reviewed at the time of this encounter:  Laboratory Studies      Latest Ref Rng & Units 10/01/2018    3:13 AM 09/30/2018    6:41 AM 09/28/2018    2:53 AM  CBC  WBC 4.0 - 10.5 K/uL 9.7  8.8  12.6   Hemoglobin 13.0 - 17.0 g/dL 84.3  85.1  85.8   Hematocrit 39.0 - 52.0 % 48.1  47.1  44.0   Platelets 150 - 400 K/uL 373  370  315     No results found for: LIPASE    Latest Ref Rng & Units 10/01/2018    3:13 AM 09/30/2018    6:41 AM 09/28/2018    2:53 AM  CMP  Glucose 70 - 99 mg/dL 875  875  858   BUN 8 - 23 mg/dL 21  19  18    Creatinine 0.61 - 1.24 mg/dL 8.92  8.96  8.95   Sodium 135 - 145 mmol/L 146  148  149   Potassium 3.5 - 5.1 mmol/L 4.0  4.1  3.9   Chloride 98 - 111 mmol/L 109  111  112   CO2 22 - 32 mmol/L 28  25  25    Calcium  8.9 - 10.3 mg/dL 8.7  8.8  8.3   Total Protein 6.5 - 8.1 g/dL   6.6   Total Bilirubin 0.3 - 1.2 mg/dL   0.6   Alkaline Phos 38 - 126 U/L   62   AST 15 - 41 U/L   98   ALT 0 - 44 U/L   131      Imaging Studies  Barium esophagram 12/01/2023 Mild esophageal  dysmotility. Otherwise unremarkable exam.   GI Procedures and Studies  Patient reports having a colonoscopy many years ago -results not available   Clinical Impression  It is my clinical impression that Mr. Basilio is a 78 y.o. male with;  Dysphagia -barium esophagram with mild dysmotility History of diverticulitis 2021 Family history of colorectal cancer in a first-degree relative-daughter diagnosed age 56 Possible chronic liver disease/cirrhosis  Mr. Bobrowski presents to the office today primarily to discuss dysphagia to both solids and liquids that has been  occurring over the last month.  He describes a sensation of difficulty breathing and feeling as though it is hard to swallow.  No odynophagia.  He has not had any neck surgeries.  No palpable masses on his neck today.  Barium esophagram did not show any strictures or stenoses but did suggest mild esophageal dysmotility.  He also tells me today that he has had pain chewing his food on the right side of his mouth.  We discussed that if he is having problems with mastication it could impact his ability to adequately chew food and swallow.  Reviewed other differential diagnoses of conditions such as eosinophilic esophagitis which seems less likely.  No masses seen on barium esophagram.  Silent reflux or esophagitis could contribute.  I think it is important for him to be evaluated by his dentist.  We also discussed performing an upper endoscopy and he is amenable to this.  He also reports a family history of colorectal cancer in his daughter who was diagnosed at the age of 70.  He had a colonoscopy many years ago but does not recall the results.  His chart documents that he has had diverticulitis at some point in the past.  In light of his family history and reported diverticulitis it would be important to perform a colonoscopy and he is willing to proceed.  In reviewing Mr. Soules chart, it is also noteworthy that an ultrasound was recently  ordered for an indication of cirrhosis.  His daughter thinks they may have been recently told he had elevated liver enzymes but is not aware of any chronic liver disease.  Advised that we could proceed with some basic laboratory studies today related to liver disease and cirrhosis but will await the results of his ultrasound before proceeding in any further depth or detail.  Plan  Schedule EGD and colonoscopy at [facility] with 1 day bowel prep If results of EGD are unremarkable for source of dysphagia and symptoms persist despite future dental work consider modified barium swallow and/or esophageal manometry Labs today: CBC, CMP, PT/INR, HIV, HBV, HCV testing Follow-up results of abdominal ultrasound ordered by PCP.  Planned Follow Up 3-4 months  The patient or caregiver verbalized understanding of the material covered, with no barriers to understanding. All questions were answered. Patient or caregiver is agreeable with the plan outlined above.    It was a pleasure to see Ramiro.  If you have any questions or concerns regarding this evaluation, do not hesitate to contact me.  Inocente Hausen, MD [facility]

## 2023-12-28 ENCOUNTER — Encounter: Payer: Self-pay | Admitting: Family Medicine

## 2023-12-28 ENCOUNTER — Encounter: Payer: Self-pay | Admitting: Pediatrics

## 2023-12-28 ENCOUNTER — Ambulatory Visit: Admitting: Pediatrics

## 2023-12-28 ENCOUNTER — Other Ambulatory Visit (INDEPENDENT_AMBULATORY_CARE_PROVIDER_SITE_OTHER)

## 2023-12-28 VITALS — BP 180/100 | HR 76 | Ht 63.0 in | Wt 170.0 lb

## 2023-12-28 DIAGNOSIS — R1310 Dysphagia, unspecified: Secondary | ICD-10-CM

## 2023-12-28 DIAGNOSIS — Z800 Family history of malignant neoplasm of digestive organs: Secondary | ICD-10-CM

## 2023-12-28 DIAGNOSIS — K769 Liver disease, unspecified: Secondary | ICD-10-CM

## 2023-12-28 DIAGNOSIS — Z8719 Personal history of other diseases of the digestive system: Secondary | ICD-10-CM

## 2023-12-28 LAB — GAMMA GT: GGT: 28 U/L (ref 7–51)

## 2023-12-28 LAB — COMPREHENSIVE METABOLIC PANEL WITH GFR
ALT: 29 U/L (ref 0–53)
AST: 26 U/L (ref 0–37)
Albumin: 4.3 g/dL (ref 3.5–5.2)
Alkaline Phosphatase: 70 U/L (ref 39–117)
BUN: 12 mg/dL (ref 6–23)
CO2: 30 meq/L (ref 19–32)
Calcium: 9.4 mg/dL (ref 8.4–10.5)
Chloride: 101 meq/L (ref 96–112)
Creatinine, Ser: 0.98 mg/dL (ref 0.40–1.50)
GFR: 73.75 mL/min (ref >60.00)
Glucose, Bld: 234 mg/dL — ABNORMAL HIGH (ref 70–99)
Potassium: 4.1 meq/L (ref 3.5–5.1)
Sodium: 138 meq/L (ref 135–145)
Total Bilirubin: 0.6 mg/dL (ref 0.2–1.2)
Total Protein: 7.5 g/dL (ref 6.0–8.3)

## 2023-12-28 LAB — CBC WITH DIFFERENTIAL/PLATELET
Basophils Absolute: 0.2 10*3/uL — ABNORMAL HIGH (ref 0.0–0.1)
Basophils Relative: 2.3 % (ref 0.0–3.0)
Eosinophils Absolute: 0.1 10*3/uL (ref 0.0–0.7)
Eosinophils Relative: 1.7 % (ref 0.0–5.0)
HCT: 44.6 % (ref 39.0–52.0)
Hemoglobin: 14.7 g/dL (ref 13.0–17.0)
Lymphocytes Relative: 44.5 % (ref 12.0–46.0)
Lymphs Abs: 3.6 10*3/uL (ref 0.7–4.0)
MCHC: 32.9 g/dL (ref 30.0–36.0)
MCV: 80.5 fl (ref 78.0–100.0)
Monocytes Absolute: 0.5 10*3/uL (ref 0.1–1.0)
Monocytes Relative: 6.6 % (ref 3.0–12.0)
Neutro Abs: 3.6 10*3/uL (ref 1.4–7.7)
Neutrophils Relative %: 44.9 % (ref 43.0–77.0)
Platelets: 226 10*3/uL (ref 150.0–400.0)
RBC: 5.54 Mil/uL (ref 4.22–5.81)
RDW: 14.5 % (ref 11.5–15.5)
WBC: 8.1 10*3/uL (ref 4.0–10.5)

## 2023-12-28 LAB — PROTIME-INR
INR: 1 ratio (ref 0.8–1.0)
Prothrombin Time: 10.7 s (ref 9.6–13.1)

## 2023-12-28 MED ORDER — NA SULFATE-K SULFATE-MG SULF 17.5-3.13-1.6 GM/177ML PO SOLN: 1.0000 | Freq: Once | ORAL | 0 refills | Status: AC

## 2023-12-28 NOTE — Patient Instructions (Addendum)
 Your provider has requested that you go to the basement level for lab work before leaving today. Press B on the elevator. The lab is located at the first door on the left as you exit the elevator.  Due to recent changes in healthcare laws, you may see the results of your imaging and laboratory studies on MyChart before your provider has had a chance to review them.  We understand that in some cases there may be results that are confusing or concerning to you. Not all laboratory results come back in the same time frame and the provider may be waiting for multiple results in order to interpret others.  Please give us  48 hours in order for your provider to thoroughly review all the results before contacting the office for clarification of your results.    You have been scheduled for an endoscopy and colonoscopy. Please follow the written instructions given to you at your visit today.  If you use inhalers (even only as needed), please bring them with you on the day of your procedure.  DO NOT TAKE 7 DAYS PRIOR TO TEST- Trulicity (dulaglutide) Ozempic, Wegovy (semaglutide) Mounjaro (tirzepatide) Bydureon Bcise (exanatide extended release)  DO NOT TAKE 1 DAY PRIOR TO YOUR TEST Rybelsus (semaglutide) Adlyxin (lixisenatide) Victoza (liraglutide) Byetta (exanatide) ___________________________________________________________________________   Follow up in 3-4 months.  Thank you for entrusting me with your care and for choosing [facility], Dr. Inocente Hausen  _______________________________________________________  If your blood pressure at your visit was 140/90 or greater, please contact your primary care physician to follow up on this.  _______________________________________________________  If you are age 47 or older, your body mass index should be between 23-30. Your Body mass index is 30.11 kg/m. If this is out of the aforementioned range listed, please consider follow up with  your Primary Care Provider.  If you are age 31 or younger, your body mass index should be between 19-25. Your Body mass index is 30.11 kg/m. If this is out of the aformentioned range listed, please consider follow up with your Primary Care Provider.   ________________________________________________________  The [facility] providers would like to encourage you to use MYCHART to communicate with providers for non-urgent requests or questions.  Due to long hold times on the telephone, sending your provider a message by [facility] may be a faster and more efficient way to get a response.  Please allow 48 business hours for a response.  Please remember that this is for non-urgent requests.  _______________________________________________________

## 2023-12-29 ENCOUNTER — Ambulatory Visit
Admission: RE | Admit: 2023-12-29 | Discharge: 2023-12-29 | Disposition: A | Discharge status: Home / Self Care | Source: Ambulatory Visit | Attending: Family Medicine | Admitting: Family Medicine

## 2023-12-29 DIAGNOSIS — K7469 Other cirrhosis of liver: Secondary | ICD-10-CM

## 2023-12-29 LAB — HEPATITIS C ANTIBODY: Hepatitis C Ab: NONREACTIVE

## 2023-12-29 LAB — HEPATITIS A ANTIBODY, TOTAL: Hepatitis A AB,Total: REACTIVE — AB

## 2023-12-29 LAB — HEPATITIS B SURFACE ANTIBODY,QUALITATIVE: Hep B S Ab: NONREACTIVE

## 2023-12-29 LAB — HEPATITIS B CORE ANTIBODY, TOTAL: Hep B Core Total Ab: NONREACTIVE

## 2023-12-29 LAB — HEPATITIS B SURFACE ANTIGEN: Hepatitis B Surface Ag: NONREACTIVE

## 2024-01-02 ENCOUNTER — Encounter: Payer: Self-pay | Admitting: Pediatrics

## 2024-01-27 ENCOUNTER — Other Ambulatory Visit: Payer: Self-pay

## 2024-01-27 ENCOUNTER — Emergency Department (HOSPITAL_COMMUNITY)
Admission: EM | Admit: 2024-01-27 | Discharge: 2024-01-27 | Disposition: A | Discharge status: Home / Self Care | Attending: Emergency Medicine | Admitting: Emergency Medicine

## 2024-01-27 DIAGNOSIS — T63444A Toxic effect of venom of bees, undetermined, initial encounter: Secondary | ICD-10-CM | POA: Insufficient documentation

## 2024-01-27 MED ORDER — DIPHENHYDRAMINE HCL 25 MG PO TABS: 25.0000 mg | ORAL_TABLET | Freq: Four times a day (QID) | ORAL | 0 refills | Status: AC | PRN

## 2024-01-27 MED ORDER — IBUPROFEN 400 MG PO TABS
600.0000 mg | ORAL_TABLET | Freq: Once | ORAL | Status: AC
  Administered 2024-01-27: 600 mg via ORAL
  Filled 2024-01-27: qty 1

## 2024-01-27 NOTE — ED Triage Notes (Signed)
 Patient stung by bees at right hand and occipital scalp yesterday afternoon with mild swelling , took 2 Benadryl tabs prior to arrival . Airway intact /respirations unlabored . Hypertensive at at triage .

## 2024-01-27 NOTE — ED Provider Notes (Signed)
 [place] [facility] AT [facility] Provider Note   CSN: [id number] Arrival date & time: 01/27/24  0038     Patient presents with: Steven Jimenez (Hypertensive/)   Steven Jimenez is a 78 y.o. male.   Patient to ED after being stung multiple times by bees. No SOB, rash, lip/tongue swelling or throat fullness. He states there is localized swelling and pain only. He took benadryl and applied a topical cream at home with minimal relief.   The history is provided by the patient. No language interpreter was used.       Prior to Admission medications   Medication Sig Start Date End Date Taking? Authorizing Provider  amoxicillin (AMOXIL) 500 MG tablet Take 500 mg by mouth 3 (three) times daily. 12/26/23   [provider]  atorvastatin (LIPITOR) 40 MG tablet Take 40 mg by mouth daily. Patient not taking: Reported on 09/29/2023 08/04/21   [provider]  ibuprofen  (ADVIL ) 400 MG tablet Take by mouth. Patient not taking: Reported on 09/29/2023 09/21/21   [provider]  lisinopril (ZESTRIL) 5 MG tablet Take 5 mg by mouth daily. 12/05/23   [provider]  metoprolol succinate (TOPROL-XL) 25 MG 24 hr tablet Take 25 mg by mouth daily. 08/04/21   [provider]  rosuvastatin  (CRESTOR ) 20 MG tablet Take 20 mg by mouth at bedtime. 07/08/23   [provider]    Allergies: Patient has no known allergies.    Review of Systems  Updated Vital Signs BP (!) 177/79   Pulse 88   Temp 97.7 F (36.5 C)   Resp 18   Ht 5' 5 (1.651 m)   Wt 81.2 kg   SpO2 100%   BMI 29.79 kg/m   Physical Exam Vitals and nursing note reviewed.  Constitutional:      Appearance: He is well-developed.  HENT:     Head: Normocephalic.     Mouth/Throat:     Mouth: Mucous membranes are moist.     Comments: No swelling of lips or tongue.  Cardiovascular:     Rate and Rhythm: Normal rate and regular rhythm.     Heart sounds: No murmur  heard. Pulmonary:     Effort: Pulmonary effort is normal.     Breath sounds: Normal breath sounds. No stridor. No wheezing, rhonchi or rales.  Abdominal:     Palpations: Abdomen is soft.     Tenderness: There is no abdominal tenderness. There is no guarding or rebound.  Musculoskeletal:        General: Normal range of motion.     Cervical back: Normal range of motion and neck supple.  Skin:    General: Skin is warm and dry.     Findings: No rash.  Neurological:     General: No focal deficit present.     Mental Status: He is alert and oriented to person, place, and time.     (all labs ordered are listed, but only abnormal results are displayed) Labs Reviewed - No data to display  EKG: None  Radiology: No results found.   Procedures   Medications Ordered in the ED  ibuprofen  (ADVIL ) tablet 600 mg (600 mg Oral Given 01/27/24 0220)    Clinical Course as of 01/27/24 0223  Fri Jan 27, 2024  0214 Patient with bee Jimenez to occipital scalp and right hand. No ss/sxs of allergic reaction. He is here requesting to be treated for pain. Recommended ibuprofen , continue benadryl if any itching, tyelnol. [  SU]    Clinical Course User Index [SU] Odell Balls, PA-C                                 Medical Decision Making       Final diagnoses:  Bee sting, undetermined intent, initial encounter    ED Discharge Orders     None          Odell Balls, PA-C 01/27/24 0223    Theadore Ozell HERO, MD 01/27/24 [phone]

## 2024-01-27 NOTE — Discharge Instructions (Signed)
 As we discussed, you can take ibuprofen  for pain and inflammation of the bee stings. Benadryl will help if the sites become itchy. Topical benadryl can be soothing.   Follow up with your doctor if needed.

## 2024-01-29 NOTE — Progress Notes (Unsigned)
 Steven Jimenez   Primary Care Physician:  Steven Leni Edyth DELENA, Steven Jimenez   Reason for Procedure:  Dysphagia, screening for esophageal varices in the setting of cirrhosis, colorectal cancer screening  Plan:    Upper endoscopy and colonoscopy     HPI: Steven Jimenez is a 78 y.o. male undergoing upper endoscopy for investigation of dysphagia and esophageal varices screening in the setting of cirrhosis as well as colonoscopy for colorectal cancer screening.  At the time of recent clinic visit, Steven Jimenez endorsed a 1 month history of dysphagia to solids and liquids.  Reported that he had difficulty breathing when he swallowed and felt that material was getting stuck.  No odynophagia.  Barium esophagram showed mild dysmotility but no stricture or stenosis.  Noted that he was having difficulty with dentition on the right side of his mouth that made it difficult to chew.  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  Reports having a colonoscopy many years ago and does not think there were pertinent findings.  One of his daughters was diagnosed with colorectal cancer at age 5.  He denies change in bowel habits or rectal bleeding.   Past Medical History:  Diagnosis Date   Allergic rhinitis    Enlarged prostate    HLD (hyperlipidemia)    Hypertension    Pneumonia     Past Surgical History:  Procedure Laterality Date   PROSTATE SURGERY      Prior to Admission medications   Medication Sig Start Date End Date Taking? Authorizing Provider  amoxicillin (AMOXIL) 500 MG tablet Take 500 mg by mouth 3 (three) times daily. 12/26/23   Provider, Historical, Steven Jimenez  atorvastatin (LIPITOR) 40 MG tablet Take 40 mg by mouth daily. Patient not taking: Reported on 09/29/2023 08/04/21   Provider, Historical, Steven Jimenez  diphenhydrAMINE  (BENADRYL ) 25 MG tablet Take 1 tablet (25 mg total) by mouth every 6 (six) hours as needed. 01/27/24   Steven Balls, Steven Jimenez  ibuprofen   (ADVIL ) 400 MG tablet Take by mouth. Patient not taking: Reported on 09/29/2023 09/21/21   Provider, Historical, Steven Jimenez  lisinopril (ZESTRIL) 5 MG tablet Take 5 mg by mouth daily. 12/05/23   Provider, Historical, Steven Jimenez  metoprolol succinate (TOPROL-XL) 25 MG 24 hr tablet Take 25 mg by mouth daily. 08/04/21   Provider, Historical, Steven Jimenez  rosuvastatin  (CRESTOR ) 20 MG tablet Take 20 mg by mouth at bedtime. 07/08/23   Provider, Historical, Steven Jimenez    Current Outpatient Medications  Medication Sig Dispense Refill   lisinopril (ZESTRIL) 5 MG tablet Take 5 mg by mouth daily.     metoprolol succinate (TOPROL-XL) 25 MG 24 hr tablet Take 25 mg by mouth daily.     rosuvastatin  (CRESTOR ) 20 MG tablet Take 20 mg by mouth at bedtime.     atorvastatin (LIPITOR) 40 MG tablet Take 40 mg by mouth daily. (Patient not taking: Reported on 01/30/2024)     diphenhydrAMINE  (BENADRYL ) 25 MG tablet Take 1 tablet (25 mg total) by mouth every 6 (six) hours as needed. 30 tablet 0   ibuprofen  (ADVIL ) 400 MG tablet Take by mouth. (Patient not taking: Reported on 01/30/2024)     Current Facility-Administered Medications  Medication Dose Route Frequency Provider Last Rate Last Admin   0.9 %  sodium chloride  infusion  500 mL Intravenous Once Steven Wolfman Jimenez, Steven Jimenez        Allergies as of 01/30/2024   (No Known Allergies)    Family History  Problem Relation  Age of Onset   Hypertension Mother    Diabetes Mother    Hypertension Father    Cancer Daughter        type unknown   Colon cancer Neg Hx    Stomach cancer Neg Hx    Esophageal cancer Neg Hx     Social History   Socioeconomic History   Marital status: Married    Spouse name: Not on file   Number of children: 5   Years of education: Not on file   Highest education level: Not on file  Occupational History   Not on file  Tobacco Use   Smoking status: Never   Smokeless tobacco: Never  Vaping Use   Vaping status: Never Used  Substance and Sexual Activity   Alcohol use: Yes     Comment: occ   Drug use: No   Sexual activity: Not on file  Other Topics Concern   Not on file  Social History Narrative   Not on file   Social Drivers of Health   Financial Resource Strain: Not on file  Food Insecurity: Not on file  Transportation Needs: Not on file  Jimenez Activity: Not on file  Stress: Not on file  Social Connections: Not on file  Intimate Partner Violence: Not on file    Review of Systems:  All other review of systems negative except as mentioned in the HPI.  Jimenez Exam: Vital signs BP (!) 164/88   Pulse 76   Temp 97.8 F (36.6 C)   Resp 12   Ht 5' 3 (1.6 Jimenez)   Wt 170 lb (77.1 kg)   SpO2 (!) 76%   BMI 30.11 kg/Jimenez   General:   Alert,  Well-developed, well-nourished, pleasant and cooperative in NAD Airway:  Mallampati 2 Lungs:  Clear throughout to auscultation.   Heart:  Regular rate and rhythm; no murmurs, clicks, rubs,  or gallops. Abdomen:  Soft, nontender and nondistended. Normal bowel sounds.   Neuro/Psych:  Normal mood and affect. A and O x 3  Steven Hausen, Steven Jimenez [facility]

## 2024-01-30 ENCOUNTER — Ambulatory Visit: Admitting: Pediatrics

## 2024-01-30 ENCOUNTER — Encounter: Payer: Self-pay | Admitting: Pediatrics

## 2024-01-30 VITALS — BP 162/94 | HR 73 | Temp 97.8°F | Resp 13 | Ht 63.0 in | Wt 170.0 lb

## 2024-01-30 DIAGNOSIS — K3189 Other diseases of stomach and duodenum: Secondary | ICD-10-CM

## 2024-01-30 DIAGNOSIS — D125 Benign neoplasm of sigmoid colon: Secondary | ICD-10-CM

## 2024-01-30 DIAGNOSIS — K648 Other hemorrhoids: Secondary | ICD-10-CM

## 2024-01-30 DIAGNOSIS — D123 Benign neoplasm of transverse colon: Secondary | ICD-10-CM

## 2024-01-30 DIAGNOSIS — D122 Benign neoplasm of ascending colon: Secondary | ICD-10-CM

## 2024-01-30 DIAGNOSIS — D124 Benign neoplasm of descending colon: Secondary | ICD-10-CM

## 2024-01-30 DIAGNOSIS — K2970 Gastritis, unspecified, without bleeding: Secondary | ICD-10-CM

## 2024-01-30 DIAGNOSIS — K6289 Other specified diseases of anus and rectum: Secondary | ICD-10-CM

## 2024-01-30 DIAGNOSIS — Z1211 Encounter for screening for malignant neoplasm of colon: Secondary | ICD-10-CM

## 2024-01-30 DIAGNOSIS — K2950 Unspecified chronic gastritis without bleeding: Secondary | ICD-10-CM

## 2024-01-30 DIAGNOSIS — Z800 Family history of malignant neoplasm of digestive organs: Secondary | ICD-10-CM

## 2024-01-30 DIAGNOSIS — R1310 Dysphagia, unspecified: Secondary | ICD-10-CM

## 2024-01-30 DIAGNOSIS — K7460 Unspecified cirrhosis of liver: Secondary | ICD-10-CM

## 2024-01-30 MED ORDER — SODIUM CHLORIDE 0.9 % IV SOLN: 500.0000 mL | Freq: Once | INTRAVENOUS | Status: DC

## 2024-01-30 NOTE — Progress Notes (Signed)
 Called to room to assist during endoscopic procedure.  Patient ID and intended procedure confirmed with present staff. Received instructions for my participation in the procedure from the performing physician.

## 2024-01-30 NOTE — Patient Instructions (Signed)
Discharge instructions given. Handouts on polyps,Hemorrhoids and Gastritis. Resume previous medications. YOU HAD AN ENDOSCOPIC PROCEDURE TODAY AT [facility]:   Refer to the procedure report that was given to you for any specific questions about what was found during the examination.  If the procedure report does not answer your questions, please call your gastroenterologist to clarify.  If you requested that your care partner not be given the details of your procedure findings, then the procedure report has been included in a sealed envelope for you to review at your convenience later.  YOU SHOULD EXPECT: Some feelings of bloating in the abdomen. Passage of more gas than usual.  Walking can help get rid of the air that was put into your GI tract during the procedure and reduce the bloating. If you had a lower endoscopy (such as a colonoscopy or flexible sigmoidoscopy) you may notice spotting of blood in your stool or on the toilet paper. If you underwent a bowel prep for your procedure, you may not have a normal bowel movement for a few days.  Please Note:  You might notice some irritation and congestion in your nose or some drainage.  This is from the oxygen used during your procedure.  There is no need for concern and it should clear up in a day or so.  SYMPTOMS TO REPORT IMMEDIATELY:  Following lower endoscopy (colonoscopy or flexible sigmoidoscopy):  Excessive amounts of blood in the stool  Significant tenderness or worsening of abdominal pains  Swelling of the abdomen that is new, acute  Fever of 100F or higher  Following upper endoscopy (EGD)  Vomiting of blood or coffee ground material  New chest pain or pain under the shoulder blades  Painful or persistently difficult swallowing  New shortness of breath  Fever of 100F or higher  Black, tarry-looking stools  For urgent or emergent issues, a gastroenterologist can be reached at any hour by calling [phone]  [phone]. Do not use MyChart messaging for urgent concerns.    DIET:  We do recommend a small meal at first, but then you may proceed to your regular diet.  Drink plenty of fluids but you should avoid alcoholic beverages for 24 hours.  ACTIVITY:  You should plan to take it easy for the rest of today and you should NOT DRIVE or use heavy machinery until tomorrow (because of the sedation medicines used during the test).    FOLLOW UP: Our staff will call the number listed on your records the next business day following your procedure.  We will call around 7:15- 8:00 am to check on you and address any questions or concerns that you may have regarding the information given to you following your procedure. If we do not reach you, we will leave a message.     If any biopsies were taken you will be contacted by phone or by letter within the next 1-3 weeks.  Please call us at [phone] if you have not heard about the biopsies in 3 weeks.    SIGNATURES/CONFIDENTIALITY: You and/or your care partner have signed paperwork which will be entered into your electronic medical record.  These signatures attest to the fact that that the information above on your After Visit Summary has been reviewed and is understood.  Full responsibility of the confidentiality of this discharge information lies with you and/or your care-partner.

## 2024-01-30 NOTE — Op Note (Signed)
 [facility] Patient Name: Steven Jimenez Procedure Date: 01/30/2024 3:10 PM MRN: [id number] Endoscopist: Inocente Hausen , MD, [id number] Age: 78 Referring MD:  Date of Birth: [birth year] Gender: Male Account #: [id number] Procedure:                Upper GI endoscopy Indications:              Dysphagia, Cirrhosis rule out esophageal varices Medicines:                Monitored Anesthesia Care Procedure:                Pre-Anesthesia Assessment:                           - Prior to the procedure, a History and Physical                            was performed, and patient medications and                            allergies were reviewed. The patient's tolerance of                            previous anesthesia was also reviewed. The risks                            and benefits of the procedure and the sedation                            options and risks were discussed with the patient.                            All questions were answered, and informed consent                            was obtained. Prior Anticoagulants: The patient has                            taken no anticoagulant or antiplatelet agents. ASA                            Grade Assessment: III - A patient with severe                            systemic disease. After reviewing the risks and                            benefits, the patient was deemed in satisfactory                            condition to undergo the procedure.                           After obtaining informed consent, the endoscope was  passed under direct vision. Throughout the                            procedure, the patient's blood pressure, pulse, and                            oxygen saturations were monitored continuously. The                            Olympus Scope [id number] was introduced through the                            mouth, and advanced to the second part of duodenum.                             The upper GI endoscopy was accomplished without                            difficulty. The patient tolerated the procedure                            well. Scope In: Scope Out: Findings:                 The examined esophagus was normal. No varices                            present.                           Localized mild inflammation characterized by                            erythema was found in the prepyloric region of the                            stomach. Biopsies were taken with a cold forceps                            for Helicobacter pylori testing.                           The gastric body, gastric antrum, cardia (on                            retroflexion) and gastric fundus (on retroflexion)                            were normal. Biopsies were taken with a cold                            forceps for Helicobacter pylori testing.                           Patchy mildly erythematous mucosa was found in the  duodenal bulb.                           The second portion of the duodenum was normal. Complications:            No immediate complications. Estimated blood loss:                            Minimal. Estimated Blood Loss:     Estimated blood loss was minimal. Impression:               - Normal esophagus.                           - Gastritis. Biopsied.                           - Normal gastric body, antrum, cardia and gastric                            fundus. Biopsied.                           - Erythematous duodenopathy.                           - Normal second portion of the duodenum. Recommendation:           - Await pathology results.                           - Repeat EGD in 3 years for esophageal varice                            surveillance depending upon patient's medical                            co-morbidities at that time.                           - Perform a colonoscopy today.                           - The findings and  recommendations were discussed                            with the patient's family. Inocente Hausen, MD 01/30/2024 3:51:28 PM This report has been signed electronically.

## 2024-01-30 NOTE — Op Note (Signed)
 [facility] Patient Name: Steven Jimenez Procedure Date: 01/30/2024 3:09 PM MRN: [id number] Endoscopist: Inocente Hausen , MD, [id number] Age: 78 Referring MD:  Date of Birth: 12-03-1945 Gender: Male Account #: [id number] Procedure:                Colonoscopy Indications:              Screening in patient at increased risk: Family                            history of 1st-degree relative with colorectal                            cancer before age 59 years, This is the patient's                            first colonoscopy Medicines:                Monitored Anesthesia Care Procedure:                Pre-Anesthesia Assessment:                           - Prior to the procedure, a History and Physical                            was performed, and patient medications and                            allergies were reviewed. The patient's tolerance of                            previous anesthesia was also reviewed. The risks                            and benefits of the procedure and the sedation                            options and risks were discussed with the patient.                            All questions were answered, and informed consent                            was obtained. Prior Anticoagulants: The patient has                            taken no anticoagulant or antiplatelet agents. ASA                            Grade Assessment: III - A patient with severe                            systemic disease. After reviewing the risks and  benefits, the patient was deemed in satisfactory                            condition to undergo the procedure.                           After obtaining informed consent, the colonoscope                            was passed under direct vision. Throughout the                            procedure, the patient's blood pressure, pulse, and                            oxygen saturations were monitored  continuously. The                            CF [id number] #[id number] was introduced through the anus                            and advanced to the the cecum, identified by                            appendiceal orifice and ileocecal valve. The                            colonoscopy was performed without difficulty. The                            patient tolerated the procedure well. The quality                            of the bowel preparation was good. The ileocecal                            valve, appendiceal orifice, and rectum were                            photographed. Scope In: 3:27:57 PM Scope Out: 3:46:53 PM Scope Withdrawal Time: 0 hours 16 minutes 11 seconds  Total Procedure Duration: 0 hours 18 minutes 56 seconds  Findings:                 The perianal and digital rectal examinations were                            normal. Pertinent negatives include normal                            sphincter tone and no palpable rectal lesions.                           Five sessile polyps were found in the sigmoid  colon, descending colon, transverse colon and                            ascending colon. The polyps were 3 to 5 mm in size.                            These polyps were removed with a cold snare.                            Resection and retrieval were complete.                           A 3 mm polyp was found in the transverse colon. The                            polyp was sessile. The polyp was removed with a                            cold biopsy forceps. Resection and retrieval were                            complete.                           Internal hemorrhoids were found during retroflexion.                           Anal papilla(e) were hypertrophied. Complications:            No immediate complications. Estimated Blood Loss:     Estimated blood loss was minimal. Impression:               - Five 3 to 5 mm polyps in the sigmoid colon, in                             the descending colon, in the transverse colon and                            in the ascending colon, removed with a cold snare.                            Resected and retrieved.                           - One 3 mm polyp in the transverse colon, removed                            with a cold biopsy forceps. Resected and retrieved.                           - Internal hemorrhoids.                           - Anal papilla(e) were hypertrophied. Recommendation:           -  Discharge patient to home (ambulatory).                           - Await pathology results.                           - The findings and recommendations were discussed                            with the patient's family.                           - Return to referring physician.                           - Patient has a contact number available for                            emergencies. The signs and symptoms of potential                            delayed complications were discussed with the                            patient. Return to normal activities tomorrow.                            Written discharge instructions were provided to the                            patient. Inocente Hausen, MD 01/30/2024 3:56:01 PM This report has been signed electronically.

## 2024-01-31 ENCOUNTER — Telehealth: Payer: Self-pay | Admitting: *Deleted

## 2024-01-31 NOTE — Telephone Encounter (Signed)
 No answer after follow up call. Voice message left.

## 2024-02-02 ENCOUNTER — Ambulatory Visit: Payer: Self-pay | Admitting: Pediatrics

## 2024-02-02 LAB — SURGICAL PATHOLOGY

## 2024-02-08 ENCOUNTER — Encounter: Admitting: Pediatrics

## 2024-03-27 ENCOUNTER — Encounter: Payer: Self-pay | Admitting: Pediatrics

## 2024-05-01 ENCOUNTER — Ambulatory Visit
Admission: RE | Admit: 2024-05-01 | Discharge: 2024-05-01 | Disposition: A | Source: Ambulatory Visit | Attending: Family Medicine | Admitting: Family Medicine

## 2024-05-01 ENCOUNTER — Other Ambulatory Visit: Payer: Self-pay | Admitting: Family Medicine

## 2024-05-01 DIAGNOSIS — G8929 Other chronic pain: Secondary | ICD-10-CM
